# Patient Record
Sex: Female | Born: 1970 | Race: White | Hispanic: No | Marital: Single | Smoking: Never smoker
Health system: Southern US, Community
[De-identification: ages and names within clinical notes are randomized; demographics above are authoritative.]

## PROBLEM LIST (undated history)

## (undated) DIAGNOSIS — F842 Rett's syndrome: Secondary | ICD-10-CM

## (undated) DIAGNOSIS — M810 Age-related osteoporosis without current pathological fracture: Secondary | ICD-10-CM

## (undated) DIAGNOSIS — E079 Disorder of thyroid, unspecified: Secondary | ICD-10-CM

## (undated) DIAGNOSIS — R569 Unspecified convulsions: Secondary | ICD-10-CM

## (undated) DIAGNOSIS — Z151 Genetic susceptibility to epilepsy and neurodevelopmental disorders: Secondary | ICD-10-CM

## (undated) DIAGNOSIS — F79 Unspecified intellectual disabilities: Secondary | ICD-10-CM

## (undated) DIAGNOSIS — M8580 Other specified disorders of bone density and structure, unspecified site: Secondary | ICD-10-CM

## (undated) DIAGNOSIS — R6250 Unspecified lack of expected normal physiological development in childhood: Secondary | ICD-10-CM

## (undated) DIAGNOSIS — S92909A Unspecified fracture of unspecified foot, initial encounter for closed fracture: Secondary | ICD-10-CM

## (undated) DIAGNOSIS — E039 Hypothyroidism, unspecified: Secondary | ICD-10-CM

## (undated) DIAGNOSIS — K5909 Other constipation: Secondary | ICD-10-CM

## (undated) DIAGNOSIS — B009 Herpesviral infection, unspecified: Secondary | ICD-10-CM

## (undated) HISTORY — DX: Unspecified fracture of unspecified foot, initial encounter for closed fracture: S92.909A

## (undated) HISTORY — DX: Age-related osteoporosis without current pathological fracture: M81.0

## (undated) HISTORY — DX: Unspecified intellectual disabilities: F79

## (undated) HISTORY — DX: Hypothyroidism, unspecified: E03.9

## (undated) HISTORY — DX: Rett's syndrome: F84.2

## (undated) HISTORY — DX: Genetic susceptibility to epilepsy and neurodevelopmental disorders: Z15.1

## (undated) HISTORY — DX: Unspecified lack of expected normal physiological development in childhood: R62.50

---

## 1997-11-06 ENCOUNTER — Encounter (HOSPITAL_COMMUNITY): Admission: RE | Admit: 1997-11-06 | Discharge: 1998-02-04 | Payer: Self-pay | Admitting: Family Medicine

## 2004-02-29 ENCOUNTER — Inpatient Hospital Stay (HOSPITAL_COMMUNITY): Admission: EM | Admit: 2004-02-29 | Discharge: 2004-03-03 | Payer: Self-pay | Admitting: Emergency Medicine

## 2005-11-30 ENCOUNTER — Emergency Department (HOSPITAL_COMMUNITY): Admission: EM | Admit: 2005-11-30 | Discharge: 2005-11-30 | Payer: Self-pay | Admitting: Family Medicine

## 2006-05-10 ENCOUNTER — Emergency Department (HOSPITAL_COMMUNITY): Admission: EM | Admit: 2006-05-10 | Discharge: 2006-05-10 | Payer: Self-pay | Admitting: Emergency Medicine

## 2006-05-23 ENCOUNTER — Emergency Department (HOSPITAL_COMMUNITY): Admission: EM | Admit: 2006-05-23 | Discharge: 2006-05-23 | Payer: Self-pay | Admitting: Family Medicine

## 2007-04-03 ENCOUNTER — Emergency Department (HOSPITAL_COMMUNITY): Admission: EM | Admit: 2007-04-03 | Discharge: 2007-04-03 | Payer: Self-pay | Admitting: Emergency Medicine

## 2007-04-24 ENCOUNTER — Emergency Department (HOSPITAL_COMMUNITY): Admission: EM | Admit: 2007-04-24 | Discharge: 2007-04-24 | Payer: Self-pay | Admitting: Family Medicine

## 2007-04-25 ENCOUNTER — Encounter: Admission: RE | Admit: 2007-04-25 | Discharge: 2007-04-25 | Payer: Self-pay | Admitting: Family Medicine

## 2007-09-18 ENCOUNTER — Encounter: Admission: RE | Admit: 2007-09-18 | Discharge: 2007-09-18 | Payer: Self-pay | Admitting: Family Medicine

## 2008-11-18 IMAGING — CR DG FEMUR 2V*L*
4 series · 4 of 4 positions shown · non-contrast
Comparison: None.
COMPARISON: Right ankle radiographs 04/25/07.
COMPARISON: Right tibia/fibula radiographs 04/25/07.

CLINICAL DATA: Cerebral palsy, fell from wheelchair with right greater than left hip to medial malleolar ankle pain. 
 LEFT FEMUR TWO VIEWS:

[t femur with hip  ap left]
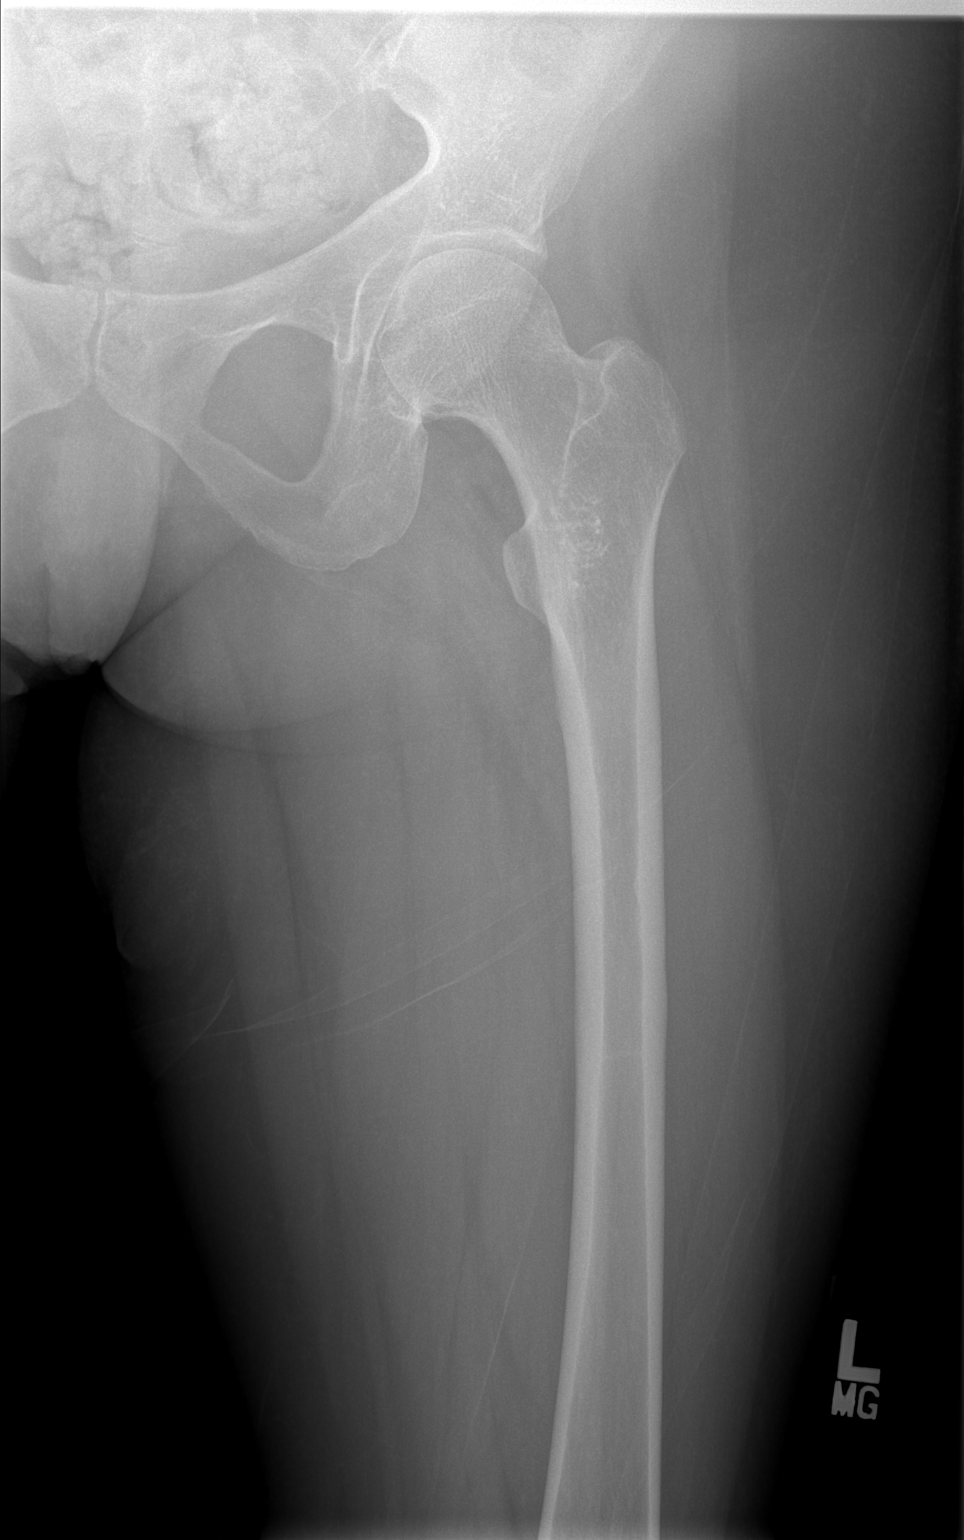

[t femur with knee ap left]
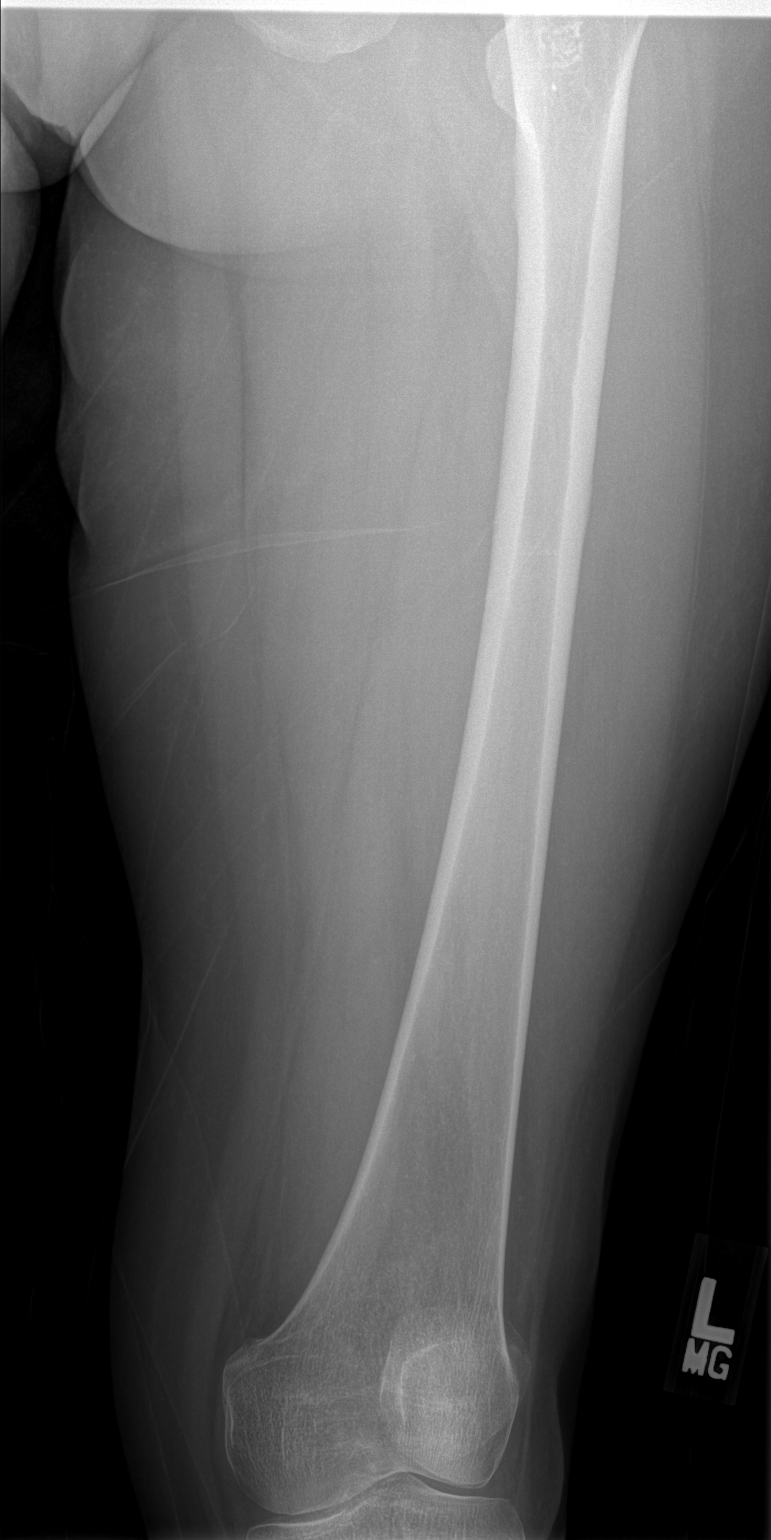

[t femur with knee lat left]
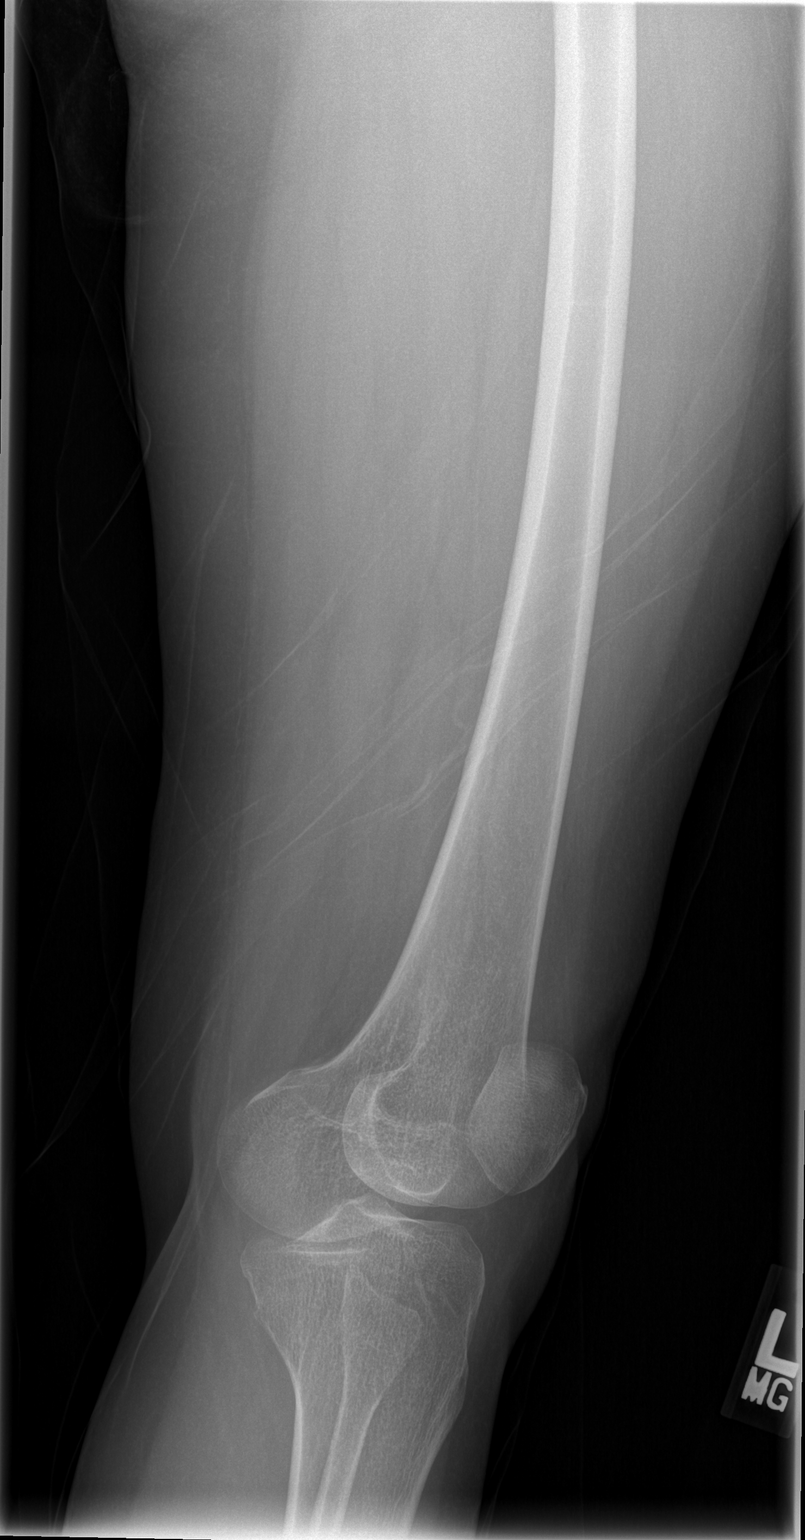

[t femur with hip lat left]
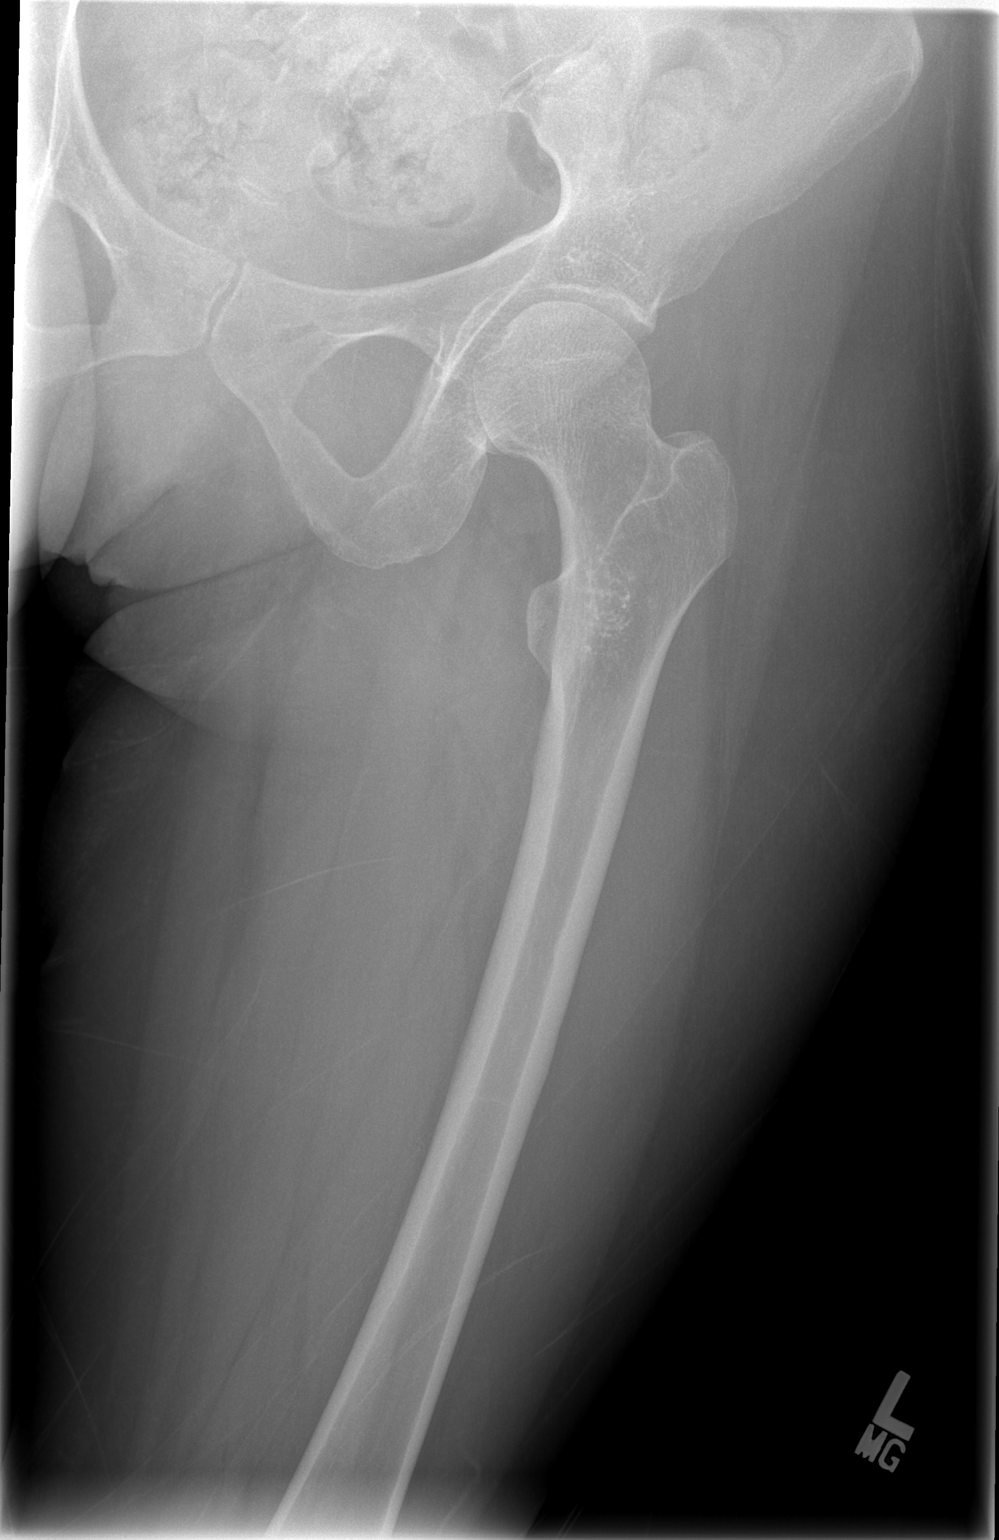

[4 of 4 positions shown; findings below may reference images not displayed]

FINDINGS: Diffuse osteopenia is seen.  No fracture, subluxation/dislocation seen.
IMPRESSION: No acute findings. 
 RIGHT FEMUR TWO VIEWS:
FINDINGS: Diffuse osteopenia is seen.  No acute fracture, subluxation/dislocation noted.
IMPRESSION: No acute findings. 
 LEFT TIBIA/FIBULA TWO VIEWS:
FINDINGS: Diffuse osteopenia is seen.  No acute fracture, subluxation/dislocation seen.
IMPRESSION: No acute findings. 
 RIGHT TIBIA/FIBULA TWO VIEWS:
FINDINGS: Non displaced medial malleolar intra-articular fracture is seen.  Diffuse osteopenia is noted.  No other acute fracture, subluxation/dislocation noted.
IMPRESSION: 1.  Non displaced intra-articular right medial malleolar fracture. 
 2.  Otherwise no acute findings. 
 LEFT ANKLE THREE VIEWS:
FINDINGS: Ankle mortis is symmetric with diffuse osteopenia noted.  No acute fracture, subluxation/dislocation is seen.
IMPRESSION: No acute findings.  
 RIGHT ANKLE THREE VIEWS:
FINDINGS: Non displaced intra-articular medial malleolar fracture is seen.  Diffuse osteopenia is seen.  Ankle mortis is symmetric.  No other acute fracture is seen.
IMPRESSION: 1.  Non displaced medial malleolar fracture (intra-articular). 
 2.  Otherwise no significant abnormality.

## 2008-11-18 IMAGING — CR DG FEMUR 2+V*R*
4 series · 4 of 4 positions shown · non-contrast
Comparison: None.
COMPARISON: Right ankle radiographs 04/25/07.
COMPARISON: Right tibia/fibula radiographs 04/25/07.

CLINICAL DATA: Cerebral palsy, fell from wheelchair with right greater than left hip to medial malleolar ankle pain. 
 LEFT FEMUR TWO VIEWS:

[t femur with hip  ap right]
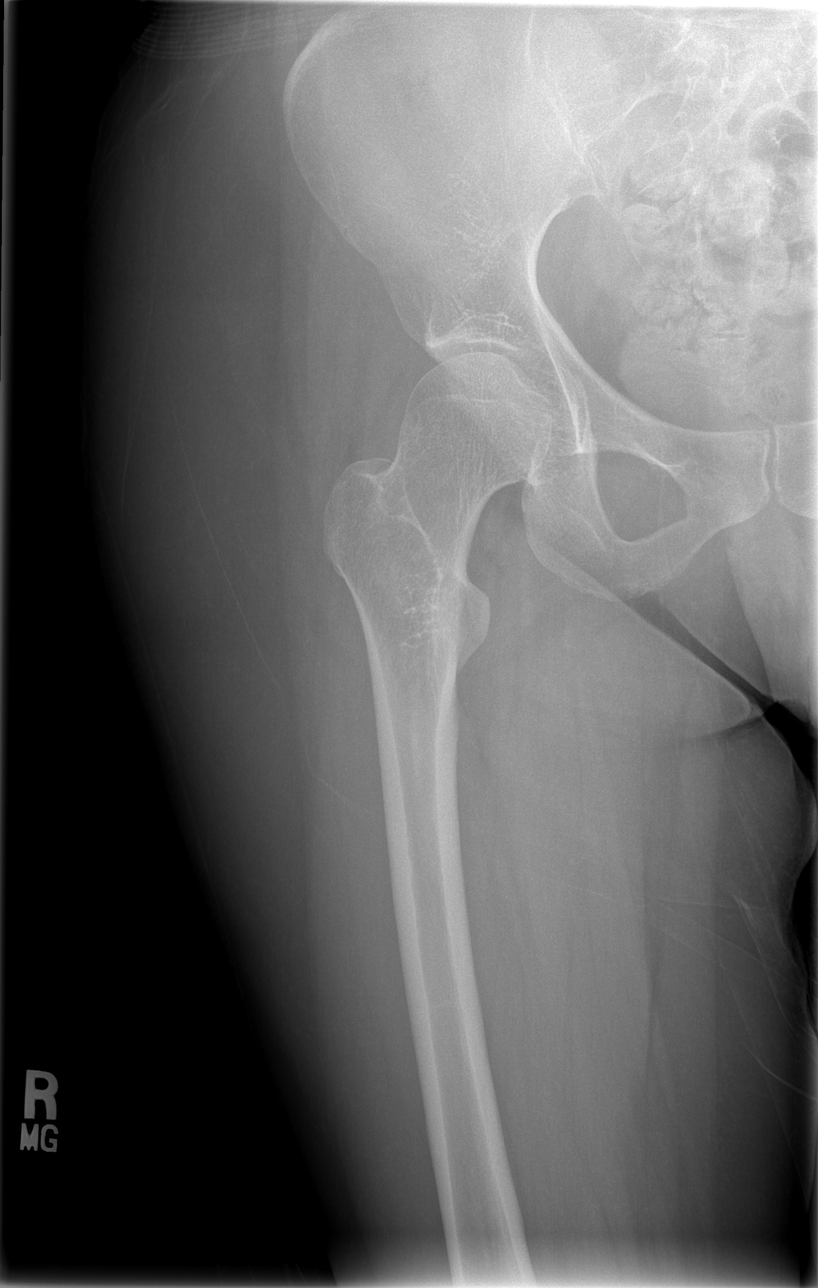

[t femur with knee ap right]
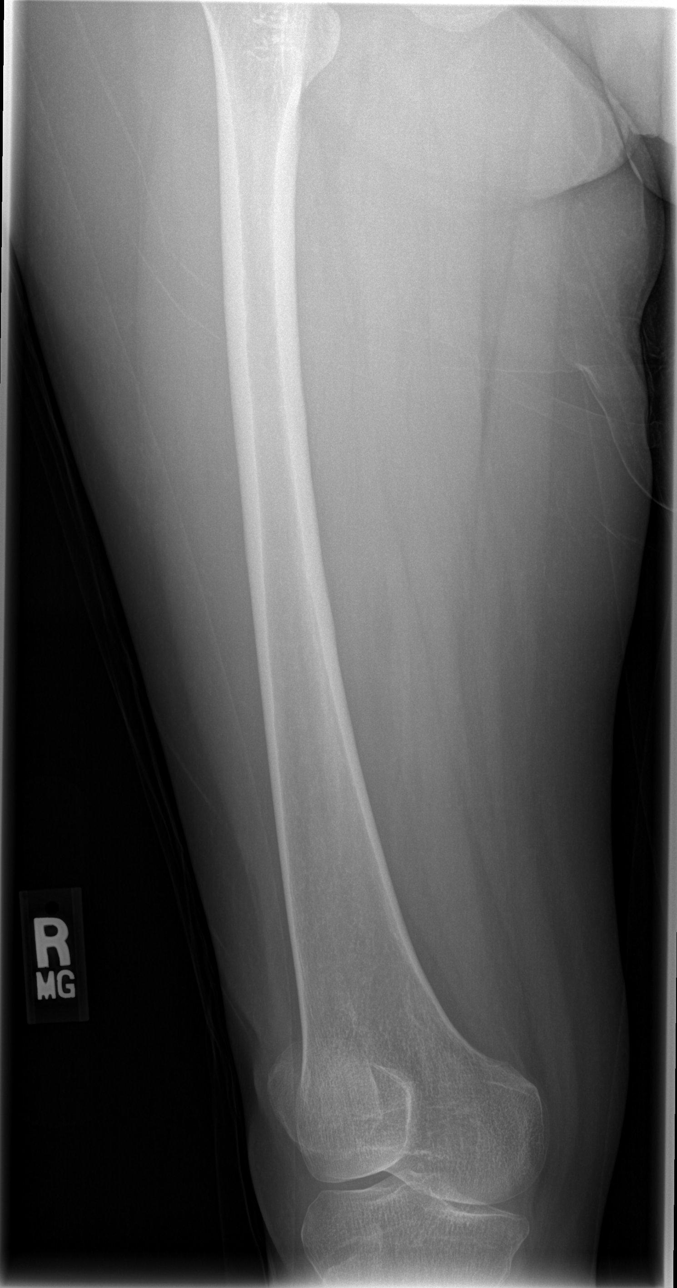

[t femur with hip lat right]
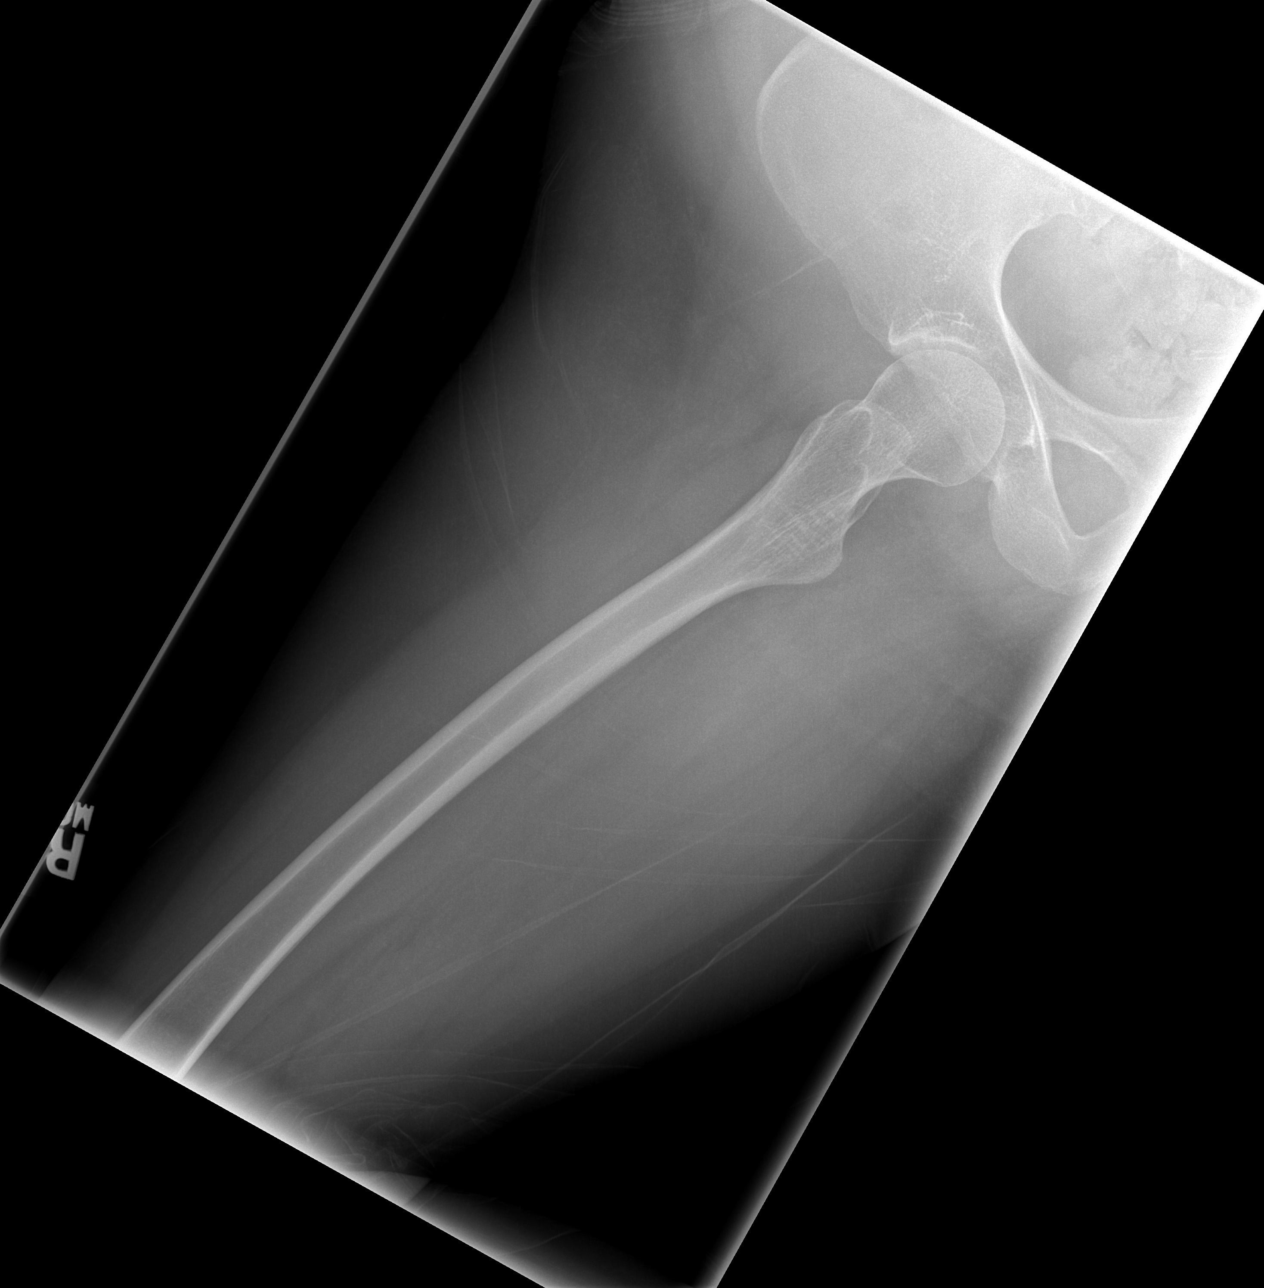

[t femur with knee lat right]
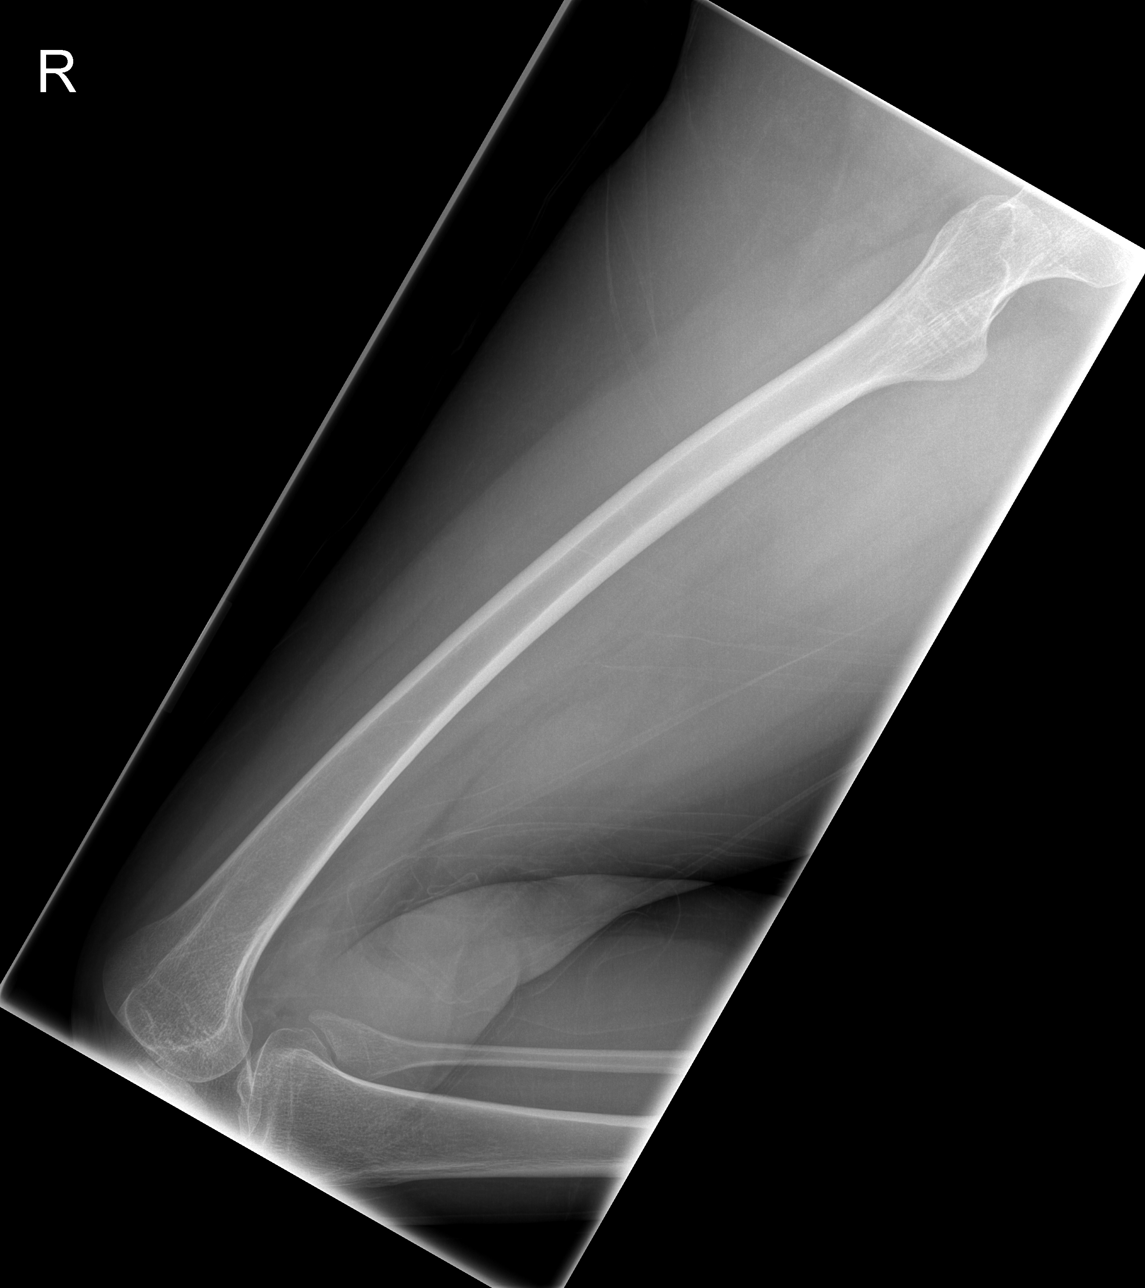

[4 of 4 positions shown; findings below may reference images not displayed]

FINDINGS: Diffuse osteopenia is seen.  No fracture, subluxation/dislocation seen.
IMPRESSION: No acute findings. 
 RIGHT FEMUR TWO VIEWS:
FINDINGS: Diffuse osteopenia is seen.  No acute fracture, subluxation/dislocation noted.
IMPRESSION: No acute findings. 
 LEFT TIBIA/FIBULA TWO VIEWS:
FINDINGS: Diffuse osteopenia is seen.  No acute fracture, subluxation/dislocation seen.
IMPRESSION: No acute findings. 
 RIGHT TIBIA/FIBULA TWO VIEWS:
FINDINGS: Non displaced medial malleolar intra-articular fracture is seen.  Diffuse osteopenia is noted.  No other acute fracture, subluxation/dislocation noted.
IMPRESSION: 1.  Non displaced intra-articular right medial malleolar fracture. 
 2.  Otherwise no acute findings. 
 LEFT ANKLE THREE VIEWS:
FINDINGS: Ankle mortis is symmetric with diffuse osteopenia noted.  No acute fracture, subluxation/dislocation is seen.
IMPRESSION: No acute findings.  
 RIGHT ANKLE THREE VIEWS:
FINDINGS: Non displaced intra-articular medial malleolar fracture is seen.  Diffuse osteopenia is seen.  Ankle mortis is symmetric.  No other acute fracture is seen.
IMPRESSION: 1.  Non displaced medial malleolar fracture (intra-articular). 
 2.  Otherwise no significant abnormality.

## 2009-10-07 ENCOUNTER — Other Ambulatory Visit: Admission: RE | Admit: 2009-10-07 | Discharge: 2009-10-07 | Payer: Self-pay | Admitting: Gynecology

## 2009-10-07 ENCOUNTER — Ambulatory Visit: Payer: Self-pay | Admitting: Gynecology

## 2010-03-29 ENCOUNTER — Emergency Department (HOSPITAL_COMMUNITY): Admission: EM | Admit: 2010-03-29 | Discharge: 2010-03-29 | Payer: Self-pay | Admitting: Emergency Medicine

## 2010-04-11 ENCOUNTER — Emergency Department (HOSPITAL_COMMUNITY): Admission: EM | Admit: 2010-04-11 | Discharge: 2010-04-12 | Payer: Self-pay | Admitting: Emergency Medicine

## 2010-08-01 ENCOUNTER — Encounter: Payer: Self-pay | Admitting: Family Medicine

## 2010-08-02 ENCOUNTER — Encounter: Payer: Self-pay | Admitting: Family Medicine

## 2010-08-31 ENCOUNTER — Emergency Department (HOSPITAL_COMMUNITY)
Admission: EM | Admit: 2010-08-31 | Discharge: 2010-08-31 | Disposition: A | Discharge status: Home / Self Care | Payer: Medicare Other | Attending: Emergency Medicine | Admitting: Emergency Medicine

## 2010-08-31 DIAGNOSIS — Z79899 Other long term (current) drug therapy: Secondary | ICD-10-CM | POA: Insufficient documentation

## 2010-08-31 DIAGNOSIS — F0280 Dementia in other diseases classified elsewhere without behavioral disturbance: Secondary | ICD-10-CM | POA: Insufficient documentation

## 2010-08-31 DIAGNOSIS — F411 Generalized anxiety disorder: Secondary | ICD-10-CM | POA: Insufficient documentation

## 2010-08-31 DIAGNOSIS — G3189 Other specified degenerative diseases of nervous system: Secondary | ICD-10-CM | POA: Insufficient documentation

## 2010-08-31 DIAGNOSIS — Z049 Encounter for examination and observation for unspecified reason: Secondary | ICD-10-CM | POA: Insufficient documentation

## 2010-08-31 DIAGNOSIS — G40909 Epilepsy, unspecified, not intractable, without status epilepticus: Secondary | ICD-10-CM | POA: Insufficient documentation

## 2010-08-31 LAB — BASIC METABOLIC PANEL WITH GFR
BUN: 11 mg/dL (ref 6–23)
CO2: 23 meq/L (ref 19–32)
Calcium: 8.6 mg/dL (ref 8.4–10.5)
Chloride: 112 meq/L (ref 96–112)
Creatinine, Ser: 0.71 mg/dL (ref 0.4–1.2)
GFR calc non Af Amer: >60 mL/min
Glucose, Bld: 128 mg/dL — ABNORMAL HIGH (ref 70–99)
Potassium: 3.8 meq/L (ref 3.5–5.1)
Sodium: 140 meq/L (ref 135–145)

## 2010-09-08 ENCOUNTER — Ambulatory Visit (HOSPITAL_COMMUNITY)
Admission: RE | Admit: 2010-09-08 | Discharge: 2010-09-08 | Disposition: A | Discharge status: Home / Self Care | Payer: Medicare Other | Source: Ambulatory Visit | Attending: Pediatrics | Admitting: Pediatrics

## 2010-09-08 ENCOUNTER — Ambulatory Visit (HOSPITAL_COMMUNITY): Payer: Medicare Other

## 2010-09-08 DIAGNOSIS — R9401 Abnormal electroencephalogram [EEG]: Secondary | ICD-10-CM | POA: Insufficient documentation

## 2010-09-08 DIAGNOSIS — G40319 Generalized idiopathic epilepsy and epileptic syndromes, intractable, without status epilepticus: Secondary | ICD-10-CM | POA: Insufficient documentation

## 2010-09-24 LAB — POCT I-STAT, CHEM 8
BUN: 18 mg/dL (ref 6–23)
Chloride: 106 mEq/L (ref 96–112)
HCT: 44 % (ref 36.0–46.0)
Potassium: 3.8 mEq/L (ref 3.5–5.1)
Sodium: 139 mEq/L (ref 135–145)

## 2010-09-24 LAB — CARBAMAZEPINE LEVEL, TOTAL: Carbamazepine Lvl: 14 ug/mL — ABNORMAL HIGH (ref 4.0–12.0)

## 2010-10-01 ENCOUNTER — Emergency Department (HOSPITAL_COMMUNITY)
Admission: EM | Admit: 2010-10-01 | Discharge: 2010-10-01 | Disposition: A | Discharge status: Home / Self Care | Payer: No Typology Code available for payment source | Attending: Emergency Medicine | Admitting: Emergency Medicine

## 2010-10-01 DIAGNOSIS — Y92410 Unspecified street and highway as the place of occurrence of the external cause: Secondary | ICD-10-CM | POA: Insufficient documentation

## 2010-10-01 DIAGNOSIS — F79 Unspecified intellectual disabilities: Secondary | ICD-10-CM | POA: Insufficient documentation

## 2010-10-01 DIAGNOSIS — G40909 Epilepsy, unspecified, not intractable, without status epilepticus: Secondary | ICD-10-CM | POA: Insufficient documentation

## 2010-10-01 LAB — POCT I-STAT, CHEM 8
BUN: 8 mg/dL (ref 6–23)
Creatinine, Ser: 0.7 mg/dL (ref 0.4–1.2)
Hemoglobin: 13.6 g/dL (ref 12.0–15.0)
Potassium: 4.6 mEq/L (ref 3.5–5.1)
Sodium: 142 mEq/L (ref 135–145)

## 2010-10-01 LAB — CARBAMAZEPINE LEVEL, TOTAL: Carbamazepine Lvl: 10.1 ug/mL (ref 4.0–12.0)

## 2010-11-27 NOTE — Discharge Summary (Signed)
NAME:  Megan Walton, Megan Walton                        ACCOUNT NO.:  [id number]   MEDICAL RECORD NO.:  [id number]                   PATIENT TYPE:  INP   LOCATION:  3006                                 FACILITY:  [facility]   PHYSICIAN:  Lonia Blood, M.D.            DATE OF BIRTH:  [birth year]   DATE OF ADMISSION:  02/28/2004  DATE OF DISCHARGE:                                 DISCHARGE SUMMARY   DISCHARGE DIAGNOSES:  1. Seizure disorder refractory to baseline care.     a. Electroencephalogram during this hospitalization revealing no new        findings.     b. Keppra added to regimen.     c. Seizures diminished at time of discharge.  2. Rett's syndrome.     a. Seizure disorder.     b. Profound mental retardation.     c. No spontaneous speech or communication.  3. Hypothyroidism.  4. Chronic constipation.  5. Severe acne.  6. Intolerance to PENICILLIN, MACROLIDE, and VALPROIC ACID.   DISCHARGE MEDICATIONS:  1. Tegretol q.i.d. dosing at 300 mg, then 200 mg, then 200 mg, then 300 mg.  2. Synthroid 50 mcg p.o. daily.  3. Senokot two p.o. b.i.d.  4. Carnitor 660 mg p.o. b.i.d.  5. Two Tums daily.  6. Keppra solution 100 mg/mL 2.5 mL b.i.d.  7. Differin to face q.a.m.  8. BenzaClin to face q.h.s.   CONSULTATIONS:  [facility].   PROCEDURES:  EEG - March 02, 2004:  Abnormal electroencephalogram showing  diffuse slowing.  The patient very drowsy during exam.  No evidence of  epileptiform activity.   FOLLOW-UP:  The patient is instructed to follow up with her primary care  physician in 14-21 days for routine recheck.  No specific blood tests,  laboratories, or physical exam findings needs to be followed at that time.  Follow-up should simply focus upon whether the patient's seizure activity is  well controlled with the addition of her Keppra.  She should follow up with  her neurologist at [facility] on an as-needed basis.   HISTORY OF PRESENT ILLNESS:   Ms. Megan Walton is a now 40 year old with  Rett's syndrome who resides in a group home.  She has a significant seizure  disorder marked by generalized tonic-clonic movements, full stiffening of  the body, drooling, and frequent drop attacks.  She has been on her  current regimen for multiple years and has been well controlled.  In the  weeks prior to her admission the family began to notice significant increase  in the frequency of seizure activity with upwards of 8-10 seizures per day.  As a result, she was transferred to [facility] emergency room for  evaluation.  She was admitted to the acute units for full neurologic  evaluation and titration of medications.   HOSPITAL COURSE:  Ms. Megan Walton was admitted to the acute units for care of  refractory seizure  disorder.  [facility] was consulted.  Ongoing seizure activity was appreciated during hospitalization.  Under the  direction of [facility], Keppra was added.  The patient's seizure  activity significantly decreased, actually just prior to Keppra initiation.  The patient tolerated Keppra without difficulty.  For the remaining 48 hours  of hospitalization the patient had no significant seizure activity  whatsoever.  An EEG as read by Dr. Avie Echevaria failed to reveal any focal  epileptiform activity and revealed only diffuse generalized slowing, which  is not surprising for the clinical situation.  The patient was felt to be  stable for discharge and on March 03, 2004 she was cleared to return to her  group home.  She will continue her baseline Tegretol at q.i.d. dosing as  noted above, with the addition of Keppra.   Ms. Megan Walton has a few other medical problems to include hypothyroidism,  chronic constipation, and significant acne.  Her usual home regimen of  medications was continued during her hospitalization for these issues.  TSH  obtained during this hospitalization was approximately 1.5 which is at our   target range.  No other medical issues were encountered and the patient was  stable at time of discharge.   The patient was cleared for discharge on March 03, 2004.  She was afebrile  and vital signs were stable.  She appeared comfortable and physical exam was  without significant findings.  She had not had a significant seizure in over  48 hours at the time of discharge.                                                Lonia Blood, M.D.    JTM/MEDQ  D:  03/03/2004  T:  03/03/2004  Job:  [id number]   cc:   Gloriajean Dell. Andrey Campanile, M.D.  [address]  [place]  [place] [address]  Fax: [phone]   Marolyn Hammock. Thad Ranger, M.D.  [address]  [address]  [place]  [place] [address]  Fax: [phone]

## 2010-11-27 NOTE — Procedures (Signed)
EEG #:  [id number]   PROCEDURE:  Electroencephalogram.   PHYSICIAN:  Genene Churn. Love, M.D.   INDICATIONS FOR PROCEDURE:  This patient has a known history of Turret's  syndrome, and has been admitted for the question of seizures.  Twenty years  ago the patient had a seizure.  The episodes are described as the patient  moving the head from side to side.   DESCRIPTION OF PROCEDURE:  This electroencephalogram was recorded during  awake and very drowsy states.  The background rhythm changes from low  voltage fast beta with some alpha activity into 5 Hz theta, which at times  is synchronous.  There is no evidence of any epileptiform activity.  The  theta activity has higher amplitude in the frontotemporal regions.  There is  no evidence of any abnormality with photic stimulation, and the slowing does  not aggravate it by photic stimulation.  Hyperventilation testing was not performed.   IMPRESSION:  This is an abnormal electroencephalogram showing diffuse  slowing.  Much of this is because of the very drowsy state.  No evidence of  any epileptiform activity was seen.  The slowing can be seen with other  etiologies, including toxic metabolic disorders, post-ictal states, etc.,  and should be taken into account with the clinical state of the patient.    Evie Lacks, M.D.   YNW:GNFA  D:  03/02/2004 13:29:30  T:  03/02/2004 15:17:40  Job #:  [id number]   cc:   Vale Haven. Andrey Campanile, M.D.  [address]  [place]  [place] [address]  Fax: [phone]

## 2010-11-27 NOTE — H&P (Signed)
NAME:  Megan Walton, Megan Walton                        ACCOUNT NO.:  [id number]   MEDICAL RECORD NO.:  [id number]                   PATIENT TYPE:  INP   LOCATION:  1826                                 FACILITY:  [facility]   PHYSICIAN:  Gloriajean Dell. Andrey Campanile, M.D.                DATE OF BIRTH:  [birth year]   DATE OF ADMISSION:  02/28/2004  DATE OF DISCHARGE:                                HISTORY & PHYSICAL   IDENTIFICATION:  A 40 year old profoundly retarded white female from a group  home.   CHIEF COMPLAINT:  Increased frequency and duration of seizures in the past  few days.   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 She is without speech and is totally  dependent.  Her seizures have been described as sudden stiffening, sometimes  rocking motions and if standing she may fall.  Her eyes roll back and she  begins drooling.  Has not had any recent head injury or other illnesses.  About 3 weeks ago her dose of Tegretol was increased from 800 mg daily to  1000 mg daily and she now has a therapeutic level but seizures have  increased in frequency, lasting now 6 minutes instead of 1 minute, and  occurring every few hours.  She is followed by a neurologist, possibly Dr.  Anne Hahn.  She was brought to the emergency room. She is admitted for seizure  control.   PAST MEDICAL HISTORY:  1. Rhett's syndrome.  2. Mental retardation noted at age 29, diagnosis of Rhett's syndrome made at     age 66.  She has lived in a group home since about 1997.  3. Hypothyroidism.  4. Constipation.   MEDICATIONS:  1. Tegretol 300 - 200 - 200 - 300 p.o.  2. Synthroid 50 mcg daily.  3. Senokot 2 p.o. b.i.d.  4. L-Carnitor 660 mg b.i.d. (felt to be helpful for some patient's with     Rhett's syndrome).  5. Tums 2 once daily.  6. Differin to face q.a.m.  7. BenzaClin to face q.h.s. for acne.   ALLERGIES/INTOLERANCE:  PENICILLINS, MACROLIDES AND VALPROIC ACID.   FAMILY/SOCIAL HISTORY:  There is no family history of seizures or mental  retardation.  She has 1 brother and 1 sister who are living and well.  Parents are well.  She requires total assistance, wears diapers, has to be  fed and does not communicate or follow commands.   PHYSICAL EXAMINATION:  GENERAL:  Examination reveals a thin, pale white  female in bed in fetal position, in no distress, eyes closed, calm, asleep.  Does not follow commands, is uncooperative. Does not have speech.  No  seizure activity observed.  VITAL SIGNS:  Temperature 98.2, pulse 80, regular, respirations 22, blood  pressure 95/61.  HEENT:  Is without apparent trauma.  She has coarse facial features. Pupils  equal and reactive but difficult to  examine. Tympanic membranes - ear canals  are clear.  NECK:  Supple without adenopathy.  No thyroid enlargement.  SKIN:  Without rash.  She has some benign moles.  HEART:  Regular, no murmurs.  LUNGS:  Clear to auscultation and percussion.  ABDOMEN:  Soft, nontender, she is wearing a diaper.  BREAST/GU/RECTAL:  Did not check.  EXTREMITIES:  She moves all extremities, has good strength, increased tone,  no clonus.  Brisk deep tendon reflexes which are symmetric.   LABORATORY DATA:  Her pH is 7.46, PCO2 34.  White count 6200, hemoglobin  13.0, platelet count 180,000, MCV 95, differential normal.  Sodium 139,  potassium 3.6, BUN 9, glucose 127.  Urinalysis normal except for slight  ketones.  Tegretol level 9.0 (4.0 - 12.0).   ASSESSMENT:  1. 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  2. Seizure disorder which is not controlled and increasing in frequency and     duration in spite of therapeutic Tegretol levels.  3. Hypothyroidism.  4. Minor skin problems.   PLAN:  Admit, observe on neurosciences unit.  Will ask hospital doctors to  follow and obtain neurology consultation.  Seizure precautions.   Check  thyroid function and liver tests.                                                Gloriajean Dell. Andrey Campanile, M.D.    FHW/MEDQ  D:  02/29/2004  T:  02/29/2004  Job:  [id number]

## 2010-11-27 NOTE — Consult Note (Signed)
NAME:  Megan Walton, Megan Walton                        ACCOUNT NO.:  [id number]   MEDICAL RECORD NO.:  [id number]                   PATIENT TYPE:  INP   LOCATION:  3006                                 FACILITY:  [facility]   PHYSICIAN:  Casimiro Needle L. Thad Ranger, M.D.           DATE OF BIRTH:  06/22/1971   DATE OF CONSULTATION:  02/29/2004  DATE OF DISCHARGE:                                   CONSULTATION   REQUESTING PHYSICIAN:  Maxwell Caul, M.D.   REASON FOR CONSULTATION:  Increased seizure activity.   HISTORY OF PRESENT ILLNESS:  This is an inpatient consultation evaluation of  this existing [facility] patient, a 40 year old woman  with a history of Rett's syndrome with resultant profound mental retardation  and epilepsy, who is followed by or practice for many years by Dr. Buzzy Han  in association with Dr. Anne Hahn.  Over the years she has been on Dilantin,  phenobarbital, Depakene, and most recently Tegretol at a present dose of  1000 mg a day in divided doses.  Her usual seizure frequency is about 1 to 2  seizures a month.  The mother took the patient out of the group home on  Thursday to get some pictures taken and noted that since Thursday night she  has had increased seizure activity.  She had about 8 seizures in the group  home on Friday and was subsequently transferred here for further evaluation  and is admitted for observation.  The patient's mother and sister, who are  at the bedside, report that her usual seizures consist of sitting up,  drooling, brief cessation of breathing, and usually lasting about 30  seconds.  The episodes she is having now, however, consist of sitting up and  drooling followed by lying back down and thrashing the head side to side  without generalized convulsive activity.  These episodes are lasting longer,  anywhere up to about 6 minutes.  She has not been feeling ill in any way as  far as her knowledge from the group home.  Has not had any  fevers. She does  note that the patient has been rubbing her nose a little bit last night and  this morning.   PAST MEDICAL HISTORY:  Remarkable for  Rett's syndrome and seizures as  above.  She also has history of hypothyroidism, constipation, and acne.   FAMILY HISTORY, SOCIAL HISTORY, REVIEW OF SYSTEMS:  As outlined in the  admission H&P by Dr. Andrey Campanile.  Remarkable for no known family history of  seizures or mental retardation.  The patient resides in a group home and has  since 1997.  Review of Systems is unavailable secondary to patient's mental  status.  No recent changes in patient's health status per report of mother  and group home.   MEDICATIONS:  1. Tegretol chewable tablets 300 mg at 7 a.m., 200 mg at 11 a.m., 200 mg at     4  p.m., and 300 mg at 8 p.m.  2. Synthroid.  3. Carnitine.  4. Senokot.  5. Enemas p.r.n.   PHYSICAL EXAMINATION:  VITAL SIGNS:  Temperature 99.3, blood pressure 98/54,  pulse 72 and regular, respirations 16.  GENERAL:  This is a profoundly mentally impaired woman supine in the  hospital bed in no evident distress.  HEENT:  Head:  Cranium is normocephalic and atraumatic.  NECK:  Supple without carotid bruit.  HEART:  Regular rate and rhythm without murmur.  CHEST: Clear to limited auscultation with poor inspiration.  ABDOMEN:  Soft with normoactive bowel sounds.  NEUROLOGIC:  Mental Status:  She appears awake and alert.  She does not  follow commands or produce any speech.  She does respond to some mimicking  but is not really able to interact with the examiner.  Cranial Nerves:  She  blinks to threat from both sides.  Extraocular movements seem full.  Face,  tongue, and palate move symmetrically as best I can tell.  Motor:  Slightly  increased tone and at least antigravity strength in all tested extremity  muscles.  Sensation:  Withdraws to pain and tickle in all extremities.   LABORATORY REVIEW:  CBC and CMET are unremarkable.  Tegretol level  taken  last night at about 2100 is 9.0.  TSH is pending.  No urinalysis has been  done.   No neural imaging has been done.   IMPRESSION:  1. Epilepsy with increased seizure frequency.  The etiology of this is     unclear, but it may have to do with the stress of the event outside the     nursing home on Thursday evening.  She had several seizures yesterday but     has not had seizures since about 5 a.m. today and seems to be doing a     little bit better at present.  2. Rett's syndrome with profound mental retardation.   RECOMMENDATIONS:  For the moment, will continue her present dose of Tegretol  1000 mg a day in divided doses.  If the seizures continue, options would  include increasing the Tegretol to 300 mg four times a day and/or adding  Keppra elixir 250 mg b.i.d. Also need to continue to monitor for any  clinical signs of infection.   Will follow with you. Thank you for the consultation.                                               Michael L. Thad Ranger, M.D.    MLR/MEDQ  D:  02/29/2004  T:  02/29/2004  Job:  [id number]   cc:   Maxwell Caul, M.D.  [address]  [place]  [place] [address]  Fax: [phone]   Gloriajean Dell. Andrey Campanile, M.D.  [address]  [place]  [place] [address]  Fax: [phone]   C. Lesia Sago, M.D.  [address]  [address]  [place]  [place] [address]  Fax: [phone]

## 2011-04-22 LAB — I-STAT 8, (EC8 V) (CONVERTED LAB)
Bicarbonate: 21.7
Glucose, Bld: 136 — ABNORMAL HIGH
Hemoglobin: 16.3 — ABNORMAL HIGH
Operator id: 288331
Sodium: 138
TCO2: 23
pCO2, Ven: 30.7 — ABNORMAL LOW

## 2011-04-22 LAB — COMPREHENSIVE METABOLIC PANEL
ALT: 14
Alkaline Phosphatase: 68
BUN: 12
CO2: 21
GFR calc non Af Amer: >60
Glucose, Bld: 127 — ABNORMAL HIGH
Potassium: 3.9
Total Bilirubin: 0.6
Total Protein: 6.6

## 2011-04-22 LAB — VALPROIC ACID LEVEL: Valproic Acid Lvl: <10 — ABNORMAL LOW

## 2011-04-22 LAB — POCT I-STAT CREATININE: Operator id: 288331

## 2011-04-22 LAB — LIPASE, BLOOD: Lipase: 20

## 2011-04-22 LAB — CARBAMAZEPINE LEVEL, TOTAL: Carbamazepine Lvl: 15.9

## 2011-10-08 ENCOUNTER — Emergency Department (HOSPITAL_COMMUNITY): Payer: Medicare Other

## 2011-10-08 ENCOUNTER — Encounter (HOSPITAL_COMMUNITY): Payer: Self-pay | Admitting: Emergency Medicine

## 2011-10-08 ENCOUNTER — Emergency Department (HOSPITAL_COMMUNITY)
Admission: EM | Admit: 2011-10-08 | Discharge: 2011-10-08 | Disposition: A | Discharge status: Home / Self Care | Payer: Medicare Other | Attending: Emergency Medicine | Admitting: Emergency Medicine

## 2011-10-08 DIAGNOSIS — R627 Adult failure to thrive: Secondary | ICD-10-CM | POA: Insufficient documentation

## 2011-10-08 DIAGNOSIS — N390 Urinary tract infection, site not specified: Secondary | ICD-10-CM | POA: Insufficient documentation

## 2011-10-08 DIAGNOSIS — R531 Weakness: Secondary | ICD-10-CM

## 2011-10-08 DIAGNOSIS — R5381 Other malaise: Secondary | ICD-10-CM | POA: Insufficient documentation

## 2011-10-08 DIAGNOSIS — E079 Disorder of thyroid, unspecified: Secondary | ICD-10-CM | POA: Insufficient documentation

## 2011-10-08 DIAGNOSIS — R5383 Other fatigue: Secondary | ICD-10-CM | POA: Insufficient documentation

## 2011-10-08 HISTORY — DX: Other constipation: K59.09

## 2011-10-08 HISTORY — DX: Unspecified intellectual disabilities: F79

## 2011-10-08 HISTORY — DX: Herpesviral infection, unspecified: B00.9

## 2011-10-08 HISTORY — DX: Rett's syndrome: F84.2

## 2011-10-08 HISTORY — DX: Genetic susceptibility to epilepsy and neurodevelopmental disorders: Z15.1

## 2011-10-08 HISTORY — DX: Disorder of thyroid, unspecified: E07.9

## 2011-10-08 HISTORY — DX: Other specified disorders of bone density and structure, unspecified site: M85.80

## 2011-10-08 HISTORY — DX: Unspecified convulsions: R56.9

## 2011-10-08 LAB — URINALYSIS, ROUTINE W REFLEX MICROSCOPIC
Glucose, UA: NEGATIVE mg/dL
Hgb urine dipstick: NEGATIVE
Ketones, ur: 15 mg/dL — AB
Protein, ur: 30 mg/dL — AB

## 2011-10-08 LAB — DIFFERENTIAL
Basophils Relative: 1 % (ref 0–1)
Lymphs Abs: 1.5 10*3/uL (ref 0.7–4.0)
Monocytes Relative: 11 % (ref 3–12)
Neutro Abs: 3.6 10*3/uL (ref 1.7–7.7)
Neutrophils Relative %: 61 % (ref 43–77)

## 2011-10-08 LAB — CBC
Hemoglobin: 14.6 g/dL (ref 12.0–15.0)
MCHC: 33.5 g/dL (ref 30.0–36.0)
Platelets: 246 10*3/uL (ref 150–400)
RBC: 4.46 MIL/uL (ref 3.87–5.11)

## 2011-10-08 LAB — URINE MICROSCOPIC-ADD ON

## 2011-10-08 LAB — COMPREHENSIVE METABOLIC PANEL
ALT: 9 U/L (ref 0–35)
Albumin: 3.8 g/dL (ref 3.5–5.2)
Alkaline Phosphatase: 57 U/L (ref 39–117)
BUN: 8 mg/dL (ref 6–23)
Chloride: 107 mEq/L (ref 96–112)
Potassium: 3.7 mEq/L (ref 3.5–5.1)
Sodium: 144 mEq/L (ref 135–145)
Total Bilirubin: 0.2 mg/dL — ABNORMAL LOW (ref 0.3–1.2)

## 2011-10-08 LAB — CARBAMAZEPINE LEVEL, TOTAL: Carbamazepine Lvl: 8.7 ug/mL (ref 4.0–12.0)

## 2011-10-08 MED ORDER — SODIUM CHLORIDE 0.9 % IV SOLN
Freq: Once | INTRAVENOUS | Status: AC
  Administered 2011-10-08: 13:00:00 via INTRAVENOUS

## 2011-10-08 MED ORDER — CIPROFLOXACIN HCL 500 MG PO TABS: 500.0000 mg | ORAL_TABLET | Freq: Two times a day (BID) | ORAL | Status: AC

## 2011-10-08 NOTE — ED Provider Notes (Signed)
History     CSN: [id number]  Arrival date & time 10/08/11  1212   First MD Initiated Contact with Patient 10/08/11 1233      Chief Complaint  Patient presents with  . Failure To Thrive    poor intake    (Consider location/radiation/quality/duration/timing/severity/associated sxs/prior treatment) HPI Comments: Patient has history of MRCP, is non verbal.  Therefore a Level 5 caveat applies.    Per caregiver, patient is only eating 25% of meals and is less active than normal.  She has also had two seizures yesterday.  There is no fever.  No n/v/d.  No cough.  Otherwise no other symptoms are reported.    Patient is a 41 y.o. female presenting with weakness. The history is provided by a caregiver.  Weakness The primary symptoms include seizures. The symptoms began 2 days ago. The symptoms are worsening. The neurological symptoms are diffuse.  Additional symptoms include weakness.    Past Medical History  Diagnosis Date  . Thyroid disease   . Seizures     No past surgical history on file.  No family history on file.  History  Substance Use Topics  . Smoking status: Not on file  . Smokeless tobacco: Never Used  . Alcohol Use: No    OB History    Grav Para Term Preterm Abortions TAB SAB Ect Mult Living                  Review of Systems  Unable to perform ROS Neurological: Positive for seizures and weakness.    Allergies  Penicillins  Home Medications  No current outpatient prescriptions on file.  BP 93/56  Pulse 66  Temp(Src) 97.5 F (36.4 C) (Oral)  Resp 16  LMP 10/01/2011  Physical Exam  Nursing note and vitals reviewed. Constitutional: She appears well-developed and well-nourished. No distress.  HENT:  Head: Normocephalic and atraumatic.  Eyes: EOM are normal. Pupils are equal, round, and reactive to light.  Neck: Normal range of motion. Neck supple.  Cardiovascular: Normal rate and regular rhythm.   No murmur heard. Pulmonary/Chest: Effort  normal and breath sounds normal. No respiratory distress. She has no wheezes.  Abdominal: Soft. Bowel sounds are normal. She exhibits no distension.  Musculoskeletal: She exhibits no edema.  Neurological: She is alert.       Unable to assess, patient is non-verbal and does not appear to comprehend questions.  Skin: She is not diaphoretic.    ED Course  Procedures (including critical care time)   Labs Reviewed  CARBAMAZEPINE LEVEL, TOTAL  CBC  DIFFERENTIAL  COMPREHENSIVE METABOLIC PANEL  URINALYSIS, ROUTINE W REFLEX MICROSCOPIC   No results found.   No diagnosis found.    MDM  The labs and chest xray look okay.  The ua shows a uti.  She was hydrated and is now looking better.  Her mother is at bedside and says patient is at baseline.        Geoffery Lyons, MD 10/08/11 [phone]

## 2011-10-08 NOTE — Discharge Instructions (Signed)

## 2011-10-08 NOTE — ED Notes (Signed)
Patient transported to X-ray 

## 2011-10-08 NOTE — ED Notes (Signed)
Per caregiver, only eating 25% of her meals-had 2 seizures yesterday

## 2011-12-08 ENCOUNTER — Encounter: Payer: Self-pay | Admitting: *Deleted

## 2011-12-10 ENCOUNTER — Encounter: Payer: Self-pay | Admitting: Gynecology

## 2011-12-10 ENCOUNTER — Ambulatory Visit (INDEPENDENT_AMBULATORY_CARE_PROVIDER_SITE_OTHER): Payer: Medicare Other | Admitting: Gynecology

## 2011-12-10 VITALS — BP 92/58 | Ht 60.0 in | Wt 95.0 lb

## 2011-12-10 DIAGNOSIS — F842 Rett's syndrome: Secondary | ICD-10-CM

## 2011-12-10 DIAGNOSIS — G3189 Other specified degenerative diseases of nervous system: Secondary | ICD-10-CM

## 2011-12-10 DIAGNOSIS — F79 Unspecified intellectual disabilities: Secondary | ICD-10-CM

## 2011-12-10 NOTE — Patient Instructions (Signed)
Follow up in one to 2 years for examination.

## 2011-12-10 NOTE — Progress Notes (Signed)
Megan Walton 18-Sep-1970 [id number]        41 y.o.  for follow up exam.  Patient is from [facility] facility with a history of Rett's syndrome, epileptic seizures, bowel and bladder incontinence and profound mental retardation. Having regular menses.   Past medical history,surgical history, medications, allergies, family history and social history were all reviewed as far as possible given situation and documented in the EPIC chart. ROS:  Was performed through nursing personnel as far as possible.  Exam: Nursing personnel from facility present and assisting with exam Filed Vitals:   12/10/11 1435  BP: 92/58   General appearance  Wheelchair bound significant mental handicapped Skin grossly normal Abdominal  soft, nontender, without masses, organomegaly or hernia Breasts  without masses, retractions, discharge or axillary adenopathy. Pelvic  Ext/BUS/vagina  normal with virginal status  Bimanual cervix palpates normal   Uterus  anteverted, normal size, shape and contour, midline and mobile nontender   Adnexa  Without masses or tenderness    Anus and perineum  normal   Rectovaginal  normal sphincter tone without palpated masses or tenderness.    Assessment/Plan:  41 y.o. female with normal GYN exam. Pap smear 2011 normal. Patient is virginal and Pap smear was not repeated this year. Apparently attempted mammogram but patient wouldn't tolerate. Will leave routine screening tests such as blood work to her primary physicians. Recommend follow up GYN exam in 1-2 years as long as menses remain normal and no other issues.    Dara Lords MD, 3:09 PM 12/10/2011

## 2012-05-15 ENCOUNTER — Other Ambulatory Visit: Payer: Self-pay | Admitting: Family Medicine

## 2012-05-15 DIAGNOSIS — Z1231 Encounter for screening mammogram for malignant neoplasm of breast: Secondary | ICD-10-CM

## 2012-06-06 ENCOUNTER — Other Ambulatory Visit: Payer: Medicare Other

## 2012-07-03 ENCOUNTER — Other Ambulatory Visit: Payer: Medicare Other

## 2012-07-20 ENCOUNTER — Ambulatory Visit
Admission: RE | Admit: 2012-07-20 | Discharge: 2012-07-20 | Disposition: A | Discharge status: Home / Self Care | Payer: Medicare Other | Source: Ambulatory Visit | Attending: Family Medicine | Admitting: Family Medicine

## 2012-07-20 DIAGNOSIS — Z1231 Encounter for screening mammogram for malignant neoplasm of breast: Secondary | ICD-10-CM

## 2012-09-04 ENCOUNTER — Encounter: Payer: Self-pay | Admitting: Nurse Practitioner

## 2012-09-04 DIAGNOSIS — G3189 Other specified degenerative diseases of nervous system: Secondary | ICD-10-CM

## 2012-09-04 DIAGNOSIS — G40309 Generalized idiopathic epilepsy and epileptic syndromes, not intractable, without status epilepticus: Secondary | ICD-10-CM

## 2012-09-08 ENCOUNTER — Encounter: Payer: Self-pay | Admitting: Nurse Practitioner

## 2012-10-05 ENCOUNTER — Ambulatory Visit (INDEPENDENT_AMBULATORY_CARE_PROVIDER_SITE_OTHER): Payer: Medicare Other | Admitting: Nurse Practitioner

## 2012-10-05 ENCOUNTER — Encounter: Payer: Self-pay | Admitting: Nurse Practitioner

## 2012-10-05 VITALS — BP 87/66 | HR 76

## 2012-10-05 DIAGNOSIS — G3189 Other specified degenerative diseases of nervous system: Secondary | ICD-10-CM

## 2012-10-05 DIAGNOSIS — G40309 Generalized idiopathic epilepsy and epileptic syndromes, not intractable, without status epilepticus: Secondary | ICD-10-CM

## 2012-10-05 NOTE — Progress Notes (Signed)
HPI:   Patient returns with representative from group home. Last seen in this office March 2013. She has a history of seizure disorder,  last seizure was in June of last year. Seizure record reviewed. Her Keppra dose was increased by Dr. Dayna Barker who is her primary care provider. Levels of her drugs are also been ordered and were reviewed. According to the representative she has lost about 7 pounds in the last year. She has not had a GI workup. There has been no vomiting or nausea. She does have a good appetite. She goes to a day program.    ROS:   Weight loss  Physical Exam General: well developed, thin,  seated in wheelchair in no evident distress Head: head normocephalic and atraumatic. Oropharynx benign Neck: supple with no carotid  bruits Cardiovascular: regular rate and rhythm, no murmurs  Neurologic Exam Mental Status: Awake and  alert. MR. No speech Does not follow commands.   Cranial Nerves: Fundoscopic exam not performed. Pupils equal, briskly reactive to light. Extraocular movements full without nystagmus. Blinks to threat.   Motor: Increased tone throughout. Normal strength in all tested extremity muscles. Sensory.: withdraws to pain in all 4 extremities.  Coordination:  The patient does not cooperate for testing.  Gait and station:  In wheelchair not ambulated.   Reflexes: 1+ and symmetric. Toes downgoing.     ASSESSMENT: Seizure disorder  with last seizure in June 2013. Keppra was increased at that time by primary care and  Dilantin has been added in small amounts since last seen by primary care. Seizure record as well as levels of carbamazepine, Dilantin and Keppra were reviewed.   PLAN: She will continue medications at current doses. If her primary care feels comfortable changing her meds she can  followup with [place] when necessary.   Nilda Riggs, GNP-BC APRN

## 2012-10-05 NOTE — Progress Notes (Signed)
I have reviewed the note, and I agree with the medical plan. C. Keith Jshon Ibe MD 10/05/2012 5:35 PM  [facility] [address] [place], [place] [address]  Phone [phone] Fax [phone]  

## 2012-10-05 NOTE — Patient Instructions (Signed)
Seizure record without activity since June of last year. Continue Keppra, carbamazepine, and Dilantin at current doses. Followup in our office when necessary, if seizure activity increases.

## 2013-05-03 IMAGING — CR DG CHEST 2V
2 series · 2 of 2 positions shown · non-contrast
Comparison: None.

CLINICAL DATA: Failure to thrive

CHEST - 2 VIEW

[w chest lat]
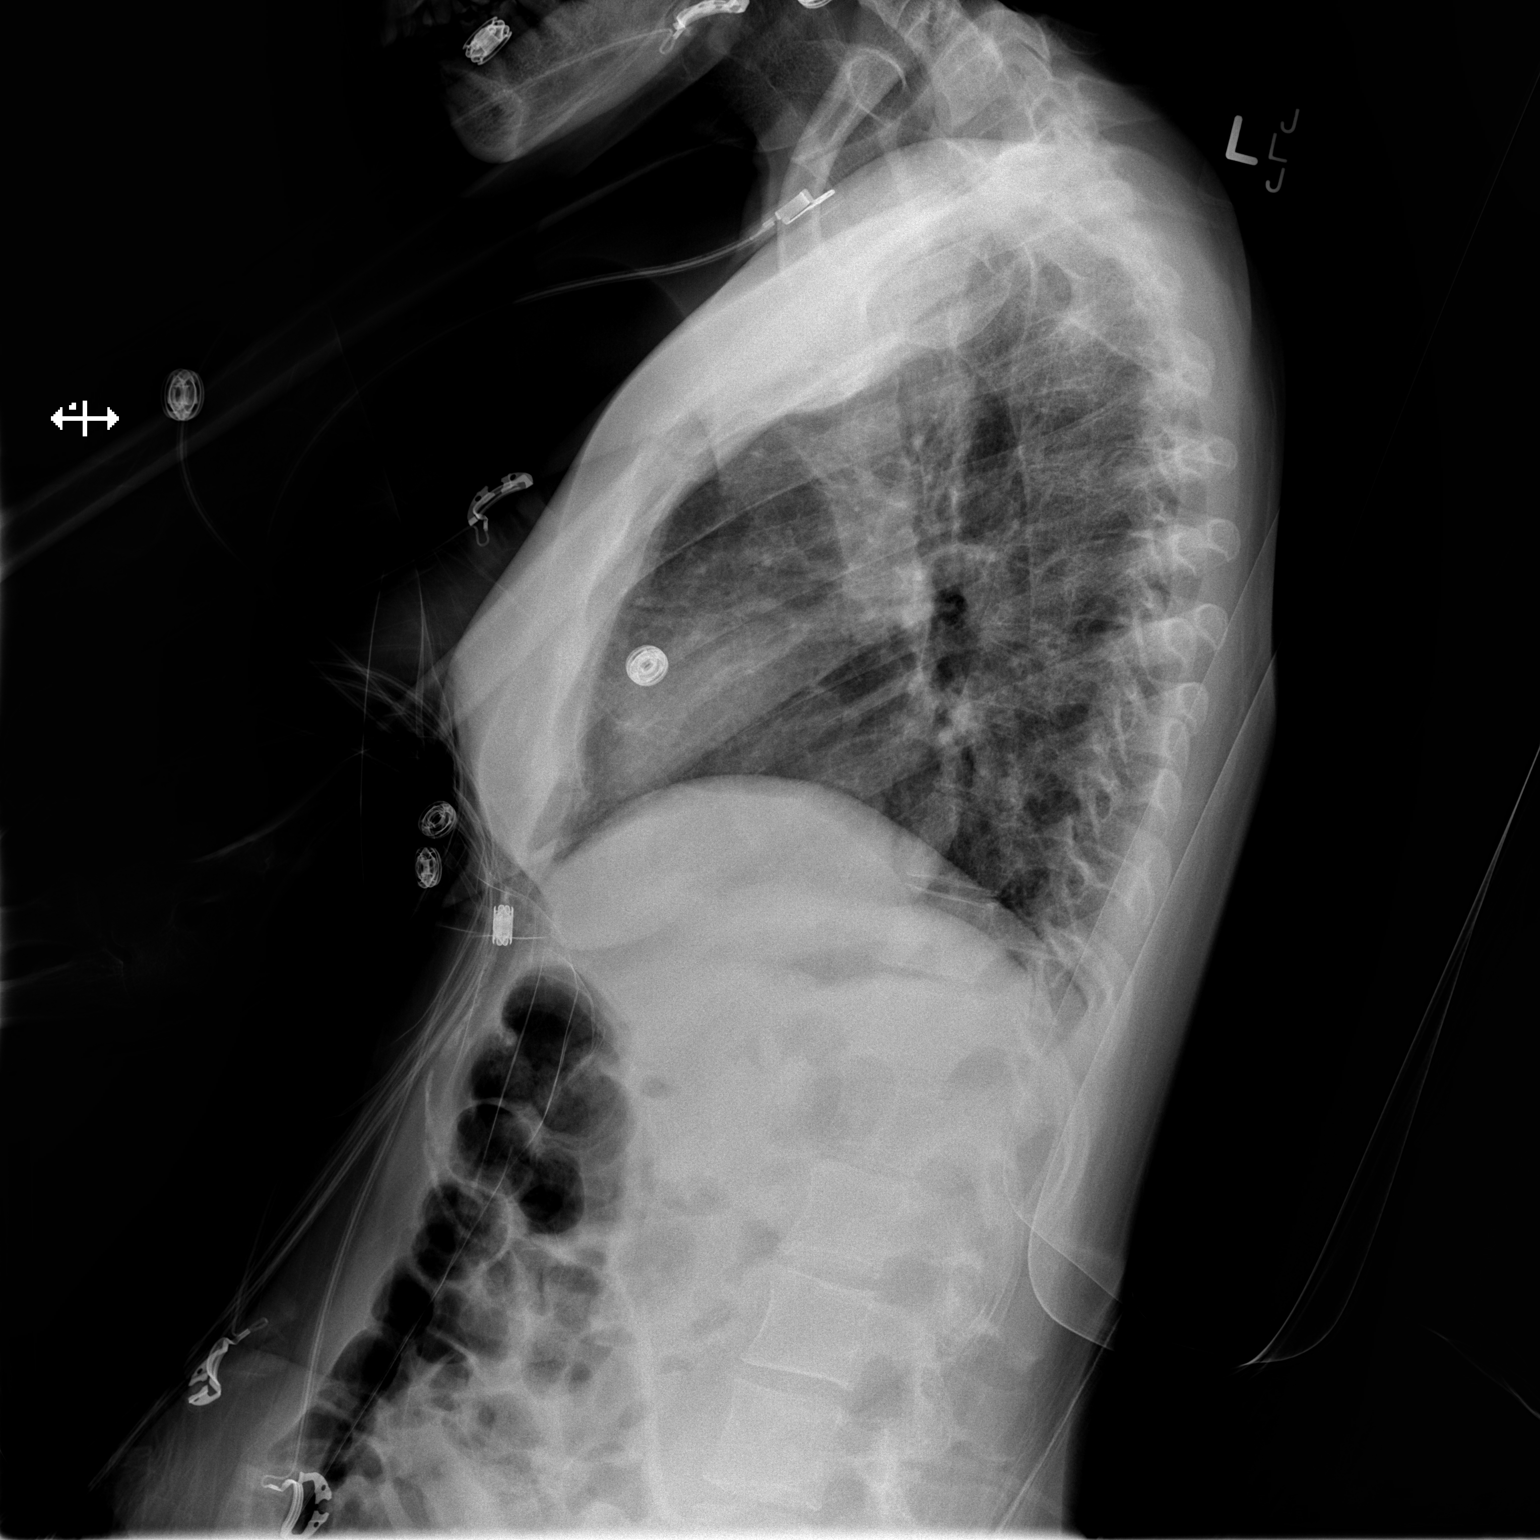

[x chest ap]
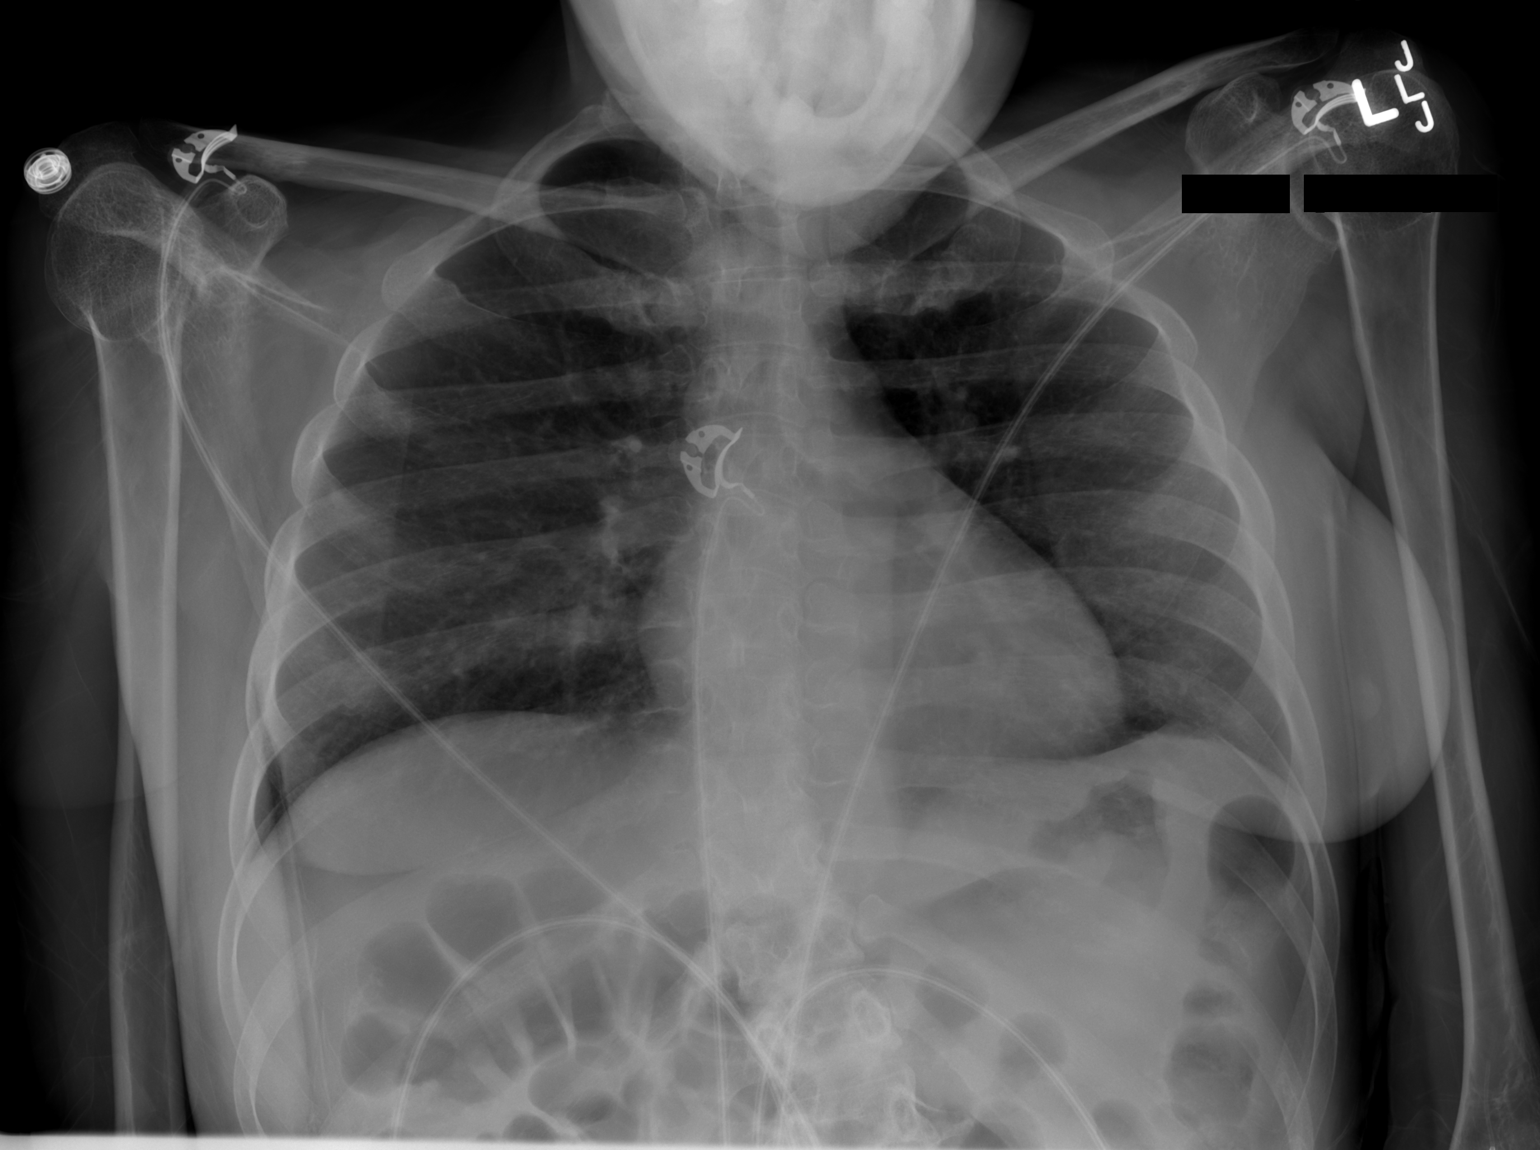

[2 of 2 positions shown; findings below may reference images not displayed]

FINDINGS: Cardiomediastinal silhouette is unremarkable.  No acute
infiltrate or pleural effusion.  No pulmonary edema.  Bony thorax
is unremarkable.
IMPRESSION: No active disease.

## 2013-08-14 ENCOUNTER — Other Ambulatory Visit: Payer: Self-pay | Admitting: Family Medicine

## 2013-08-14 DIAGNOSIS — Z1231 Encounter for screening mammogram for malignant neoplasm of breast: Secondary | ICD-10-CM

## 2013-09-11 ENCOUNTER — Other Ambulatory Visit: Payer: Self-pay | Admitting: Family Medicine

## 2013-09-11 ENCOUNTER — Ambulatory Visit
Admission: RE | Admit: 2013-09-11 | Discharge: 2013-09-11 | Disposition: A | Discharge status: Home / Self Care | Payer: Medicare Other | Source: Ambulatory Visit | Attending: Family Medicine | Admitting: Family Medicine

## 2013-09-11 DIAGNOSIS — R928 Other abnormal and inconclusive findings on diagnostic imaging of breast: Secondary | ICD-10-CM

## 2013-09-11 DIAGNOSIS — Z1231 Encounter for screening mammogram for malignant neoplasm of breast: Secondary | ICD-10-CM

## 2013-10-12 ENCOUNTER — Ambulatory Visit
Admission: RE | Admit: 2013-10-12 | Discharge: 2013-10-12 | Disposition: A | Discharge status: Home / Self Care | Payer: Medicare Other | Source: Ambulatory Visit | Attending: Family Medicine | Admitting: Family Medicine

## 2013-10-12 DIAGNOSIS — R928 Other abnormal and inconclusive findings on diagnostic imaging of breast: Secondary | ICD-10-CM

## 2013-12-18 ENCOUNTER — Other Ambulatory Visit (HOSPITAL_COMMUNITY)
Admission: RE | Admit: 2013-12-18 | Discharge: 2013-12-18 | Disposition: A | Discharge status: Home / Self Care | Payer: Medicare Other | Source: Ambulatory Visit | Attending: Gynecology | Admitting: Gynecology

## 2013-12-18 ENCOUNTER — Encounter: Payer: Self-pay | Admitting: Gynecology

## 2013-12-18 ENCOUNTER — Ambulatory Visit (INDEPENDENT_AMBULATORY_CARE_PROVIDER_SITE_OTHER): Payer: Medicare Other | Admitting: Gynecology

## 2013-12-18 VITALS — BP 100/64 | Ht 60.0 in | Wt 96.0 lb

## 2013-12-18 DIAGNOSIS — Z124 Encounter for screening for malignant neoplasm of cervix: Secondary | ICD-10-CM | POA: Insufficient documentation

## 2013-12-18 DIAGNOSIS — M949 Disorder of cartilage, unspecified: Secondary | ICD-10-CM

## 2013-12-18 DIAGNOSIS — M899 Disorder of bone, unspecified: Secondary | ICD-10-CM

## 2013-12-18 DIAGNOSIS — F842 Rett's syndrome: Secondary | ICD-10-CM

## 2013-12-18 DIAGNOSIS — M8580 Other specified disorders of bone density and structure, unspecified site: Secondary | ICD-10-CM

## 2013-12-18 DIAGNOSIS — G3189 Other specified degenerative diseases of nervous system: Secondary | ICD-10-CM

## 2013-12-18 NOTE — Progress Notes (Signed)
.   Megan Walton Jun 05, 1971 [id number]        43 y.o.  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 Having regular menses.  Information provided by her attendants  Past medical history,surgical history, problem list, medications, allergies, family history and social history were all reviewed and documented as reviewed in the EPIC chart.  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  No cervical or supraclavicular adenopathy. Thyroid normal.  Lungs:  Clear without wheezing, rales or rhonchi Cardiac: RR, without RMG Abdominal:  Soft, nontender, without masses, guarding, rebound, organomegaly or hernia Breasts:  Without masses, retractions, discharge or axillary adenopathy bilaterally. Pelvic:  Ext/BUS/vagina normal. Vaginal exam limited due to virginal status  Cervix palpates normal. Limited visualization Pap done.  Uterus  Axial, normal size, midline and mobile nontender   Adnexa  Without masses or tenderness    Anus and perineum  Normal   Rectovaginal  Normal sphincter tone without palpated masses or tenderness.    Assessment/Plan:  43 y.o. G0P0 female with normal GYN exam. Pap smear was done. Patient had mammogram 10/2013. No routine blood work or testing ordered as I will leave this to her primary physician's discretion. Followup in one to 2 years. Sooner if any issues  Note: This document was prepared with digital dictation and possible smart Lobbyist. Any transcriptional errors that result from this process are unintentional.   Dara Lords MD, 9:11 AM 12/18/2013

## 2013-12-18 NOTE — Patient Instructions (Signed)
Followup in one to 2 years for annual exam

## 2013-12-18 NOTE — Addendum Note (Signed)
Addended by: Dayna Barker on: 12/18/2013 09:52 AM   Modules accepted: Orders

## 2013-12-19 LAB — CYTOLOGY - PAP

## 2014-10-22 ENCOUNTER — Other Ambulatory Visit: Payer: Self-pay

## 2014-10-22 DIAGNOSIS — Z1231 Encounter for screening mammogram for malignant neoplasm of breast: Secondary | ICD-10-CM

## 2014-10-31 ENCOUNTER — Ambulatory Visit
Admission: RE | Admit: 2014-10-31 | Discharge: 2014-10-31 | Disposition: A | Discharge status: Home / Self Care | Payer: Medicare Other | Source: Ambulatory Visit

## 2014-10-31 DIAGNOSIS — Z1231 Encounter for screening mammogram for malignant neoplasm of breast: Secondary | ICD-10-CM

## 2015-11-24 ENCOUNTER — Other Ambulatory Visit: Payer: Self-pay | Admitting: Family Medicine

## 2015-11-24 DIAGNOSIS — Z1231 Encounter for screening mammogram for malignant neoplasm of breast: Secondary | ICD-10-CM

## 2015-12-10 ENCOUNTER — Ambulatory Visit: Payer: Medicare Other

## 2016-01-23 ENCOUNTER — Ambulatory Visit
Admission: RE | Admit: 2016-01-23 | Discharge: 2016-01-23 | Disposition: A | Discharge status: Home / Self Care | Payer: Medicare Other | Source: Ambulatory Visit | Attending: Family Medicine | Admitting: Family Medicine

## 2016-01-23 DIAGNOSIS — Z1231 Encounter for screening mammogram for malignant neoplasm of breast: Secondary | ICD-10-CM

## 2016-02-04 ENCOUNTER — Other Ambulatory Visit: Payer: Self-pay | Admitting: Family Medicine

## 2016-02-04 DIAGNOSIS — R928 Other abnormal and inconclusive findings on diagnostic imaging of breast: Secondary | ICD-10-CM

## 2016-02-06 ENCOUNTER — Other Ambulatory Visit (HOSPITAL_COMMUNITY): Payer: Self-pay | Admitting: Family Medicine

## 2016-02-06 DIAGNOSIS — R1310 Dysphagia, unspecified: Secondary | ICD-10-CM

## 2016-02-10 ENCOUNTER — Ambulatory Visit
Admission: RE | Admit: 2016-02-10 | Discharge: 2016-02-10 | Disposition: A | Discharge status: Home / Self Care | Payer: Medicare Other | Source: Ambulatory Visit | Attending: Family Medicine | Admitting: Family Medicine

## 2016-02-10 DIAGNOSIS — R928 Other abnormal and inconclusive findings on diagnostic imaging of breast: Secondary | ICD-10-CM

## 2016-02-12 ENCOUNTER — Ambulatory Visit (HOSPITAL_COMMUNITY)
Admission: RE | Admit: 2016-02-12 | Discharge: 2016-02-12 | Disposition: A | Discharge status: Home / Self Care | Payer: Medicare Other | Source: Ambulatory Visit | Attending: Family Medicine | Admitting: Family Medicine

## 2016-02-12 DIAGNOSIS — R1310 Dysphagia, unspecified: Secondary | ICD-10-CM | POA: Diagnosis not present

## 2016-02-12 DIAGNOSIS — F79 Unspecified intellectual disabilities: Secondary | ICD-10-CM | POA: Diagnosis not present

## 2016-02-12 DIAGNOSIS — F842 Rett's syndrome: Secondary | ICD-10-CM | POA: Insufficient documentation

## 2016-02-12 DIAGNOSIS — E039 Hypothyroidism, unspecified: Secondary | ICD-10-CM | POA: Insufficient documentation

## 2016-02-12 DIAGNOSIS — B009 Herpesviral infection, unspecified: Secondary | ICD-10-CM | POA: Insufficient documentation

## 2016-02-12 DIAGNOSIS — M8580 Other specified disorders of bone density and structure, unspecified site: Secondary | ICD-10-CM | POA: Diagnosis not present

## 2016-04-01 ENCOUNTER — Encounter: Payer: Self-pay | Admitting: Gynecology

## 2016-04-01 ENCOUNTER — Ambulatory Visit (INDEPENDENT_AMBULATORY_CARE_PROVIDER_SITE_OTHER): Payer: Medicare Other | Admitting: Gynecology

## 2016-04-01 VITALS — BP 114/62

## 2016-04-01 DIAGNOSIS — Z01419 Encounter for gynecological examination (general) (routine) without abnormal findings: Secondary | ICD-10-CM | POA: Diagnosis not present

## 2016-04-01 NOTE — Patient Instructions (Signed)
Follow up as needed

## 2016-04-01 NOTE — Progress Notes (Signed)
    Eula FriedShannon E Kreamer October 19, 1970 [id number]        45 y.o.  G0P0  for breast and pelvic exam. 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 Is reported to be having regular menses and no issues from a gynecologic standpoint point by her attendant.  Past medical history,surgical history, problem list, medications, allergies, family history and social history were all reviewed and documented as reviewed in the EPIC chart.  ROS:  Performed with pertinent positives and negatives included in the history, assessment and plan.   Additional significant findings :  No issues reported by the attendant.   Exam: Kennon PortelaKim Gardner assistant Vitals:   04/01/16 1528  BP: 114/62   There is no height or weight on file to calculate BMI.  General appearance:  Mental retardation with spastic cerebral palsy Skin: Grossly normal HEENT: Without gross lesions.  No cervical or supraclavicular adenopathy. Thyroid normal.  Lungs:  Clear without wheezing, rales or rhonchi Cardiac: RR, without RMG Abdominal:  Soft, nontender, without masses, guarding, rebound, organomegaly or hernia Breasts:  Without masses, retractions, discharge or axillary adenopathy. Pelvic:  Ext, BUS, Vagina virginal limiting exam  Cervix unable to visualize but palpably normal  Uterus difficult to palpate due to voluntary abdominal guarding but grossly normal in size.  Adnexa without gross masses or tenderness    Anus and perineum normal   Rectovaginal normal sphincter tone without palpated masses or tenderness.    Assessment/Plan:  45 y.o. G0P0 female for annual exam with regular menses. No reported issues by her attendant. Pap smear 12/2013 normal. Mammogram 01/2016. We'll defer any further testing to her primary physician as a feel appropriate. Follow up as needed or in one year.    [name],Darin Redmann P MD, 4:02 PM 04/01/2016

## 2016-05-22 ENCOUNTER — Emergency Department (HOSPITAL_COMMUNITY)
Admission: EM | Admit: 2016-05-22 | Discharge: 2016-05-22 | Disposition: A | Discharge status: Home / Self Care | Payer: Medicare Other | Attending: Emergency Medicine | Admitting: Emergency Medicine

## 2016-05-22 ENCOUNTER — Emergency Department (HOSPITAL_COMMUNITY): Payer: Medicare Other

## 2016-05-22 ENCOUNTER — Encounter (HOSPITAL_COMMUNITY): Payer: Self-pay | Admitting: Emergency Medicine

## 2016-05-22 DIAGNOSIS — W19XXXA Unspecified fall, initial encounter: Secondary | ICD-10-CM | POA: Insufficient documentation

## 2016-05-22 DIAGNOSIS — Y939 Activity, unspecified: Secondary | ICD-10-CM | POA: Insufficient documentation

## 2016-05-22 DIAGNOSIS — Z79899 Other long term (current) drug therapy: Secondary | ICD-10-CM | POA: Diagnosis not present

## 2016-05-22 DIAGNOSIS — R930 Abnormal findings on diagnostic imaging of skull and head, not elsewhere classified: Secondary | ICD-10-CM | POA: Insufficient documentation

## 2016-05-22 DIAGNOSIS — Y999 Unspecified external cause status: Secondary | ICD-10-CM | POA: Insufficient documentation

## 2016-05-22 DIAGNOSIS — E039 Hypothyroidism, unspecified: Secondary | ICD-10-CM | POA: Insufficient documentation

## 2016-05-22 DIAGNOSIS — S0181XA Laceration without foreign body of other part of head, initial encounter: Secondary | ICD-10-CM | POA: Diagnosis present

## 2016-05-22 DIAGNOSIS — Y929 Unspecified place or not applicable: Secondary | ICD-10-CM | POA: Diagnosis not present

## 2016-05-22 MED ORDER — LIDOCAINE-EPINEPHRINE-TETRACAINE (LET) SOLUTION
3.0000 mL | Freq: Once | NASAL | Status: AC
  Administered 2016-05-22: 3 mL via TOPICAL
  Filled 2016-05-22: qty 3

## 2016-05-22 NOTE — ED Provider Notes (Signed)
[facility]-EMERGENCY DEPT Provider Note   CSN: [id number] Arrival date & time: 05/22/16  [id number]     History   Chief Complaint Chief Complaint  Patient presents with  . Fall    HPI Megan Walton is a 45 y.o. female. She presents for evaluation after a fall. She lives in a group home and refer supervision. She had a fall this morning has laceration above her left eye. Is at her baseline per her caregiver who accompanies her. Patient is otherwise nonverbal at baseline. No recent illnesses or other change in her health recently.  HPI  Past Medical History:  Diagnosis Date  . Constipation, chronic   . Developmental delay   . Foot fracture   . Herpes simplex type 1 antibody positive   . Hypothyroidism   . Mental retardation   . Mental retardation   . 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 Osteopenia    Right Hip  . Rett syndrome   . Rett's syndrome   . Rett's syndrome   . Seizures (HCC)   . Thyroid disease     Patient Active Problem List   Diagnosis Date Noted  . 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 No pertinent surgical history.  OB History    Gravida Para Term Preterm AB Living   0             SAB TAB Ectopic Multiple Live Births                   Home Medications    Prior to Admission medications   Medication Sig Start Date End Date Taking? Authorizing Provider  acyclovir (ZOVIRAX) 400 MG tablet Take 400 mg by mouth 2 (two) times daily.    Historical Provider, MD  calcium carbonate (TUMS - DOSED IN MG ELEMENTAL CALCIUM) 500 MG chewable tablet Chew 1 tablet by mouth daily.    Historical Provider, MD  carbamazepine (TEGRETOL) 100 MG chewable tablet Chew 200-300 mg by mouth 4 (four) times daily. Patient takes 300mg  at 7am and 8pm and 200mg  11am and 4pm    Historical Provider, MD  cetaphil (CETAPHIL) cream Apply 1 application  topically daily.    Historical Provider, MD  diazepam (DIASTAT ACUDIAL) 10 MG GEL Place 10 mg rectally once. Use as needed for seizures > than 5 mins.    Historical Provider, MD  Docosanol (ABREVA) 10 % CREA Apply topically daily.    Historical Provider, MD  erythromycin with ethanol (THERAMYCIN) 2 % external solution Apply 1 application topically daily.    Historical Provider, MD  levETIRAcetam (KEPPRA) 1000 MG tablet Take by mouth. Take 1000 mg in the morning, 1250 mg at bedtime.    Historical Provider, MD  levOCARNitine (CARNITOR) 330 MG tablet Take 330 mg by mouth 3 (three) times daily. Take 2 tablets 3 x day.    Historical Provider, MD  levothyroxine (SYNTHROID, LEVOTHROID) 50 MCG tablet Take 50 mcg by mouth daily.    Historical Provider, MD  phenytoin (DILANTIN) 50 MG tablet Chew by mouth 3 (three) times daily.    Historical Provider, MD  polyethylene glycol (MIRALAX / GLYCOLAX) packet Take 17 g by mouth at bedtime. Mix 17 gram in juice or water at bedtime    Historical Provider, MD  senna (SENOKOT) 8.6 MG TABS Take 1 tablet by mouth 2 (two) times daily.    Historical Provider, MD  Vitamin D, Ergocalciferol, (DRISDOL) 50000 UNITS CAPS Take 50,000 Units by mouth every 7 (seven) days.    Historical Provider, MD    Family History Family History  Problem Relation Age of Onset  . Lymphoma Father   . Healthy Mother   . Healthy Sister   . ADD / ADHD Brother   . Healthy Brother     Social History Social History  Substance Use Topics  . Smoking status: Never Smoker  . Smokeless tobacco: Never Used  . Alcohol use No     Allergies   Biaxin [clarithromycin]; Cephalexin; Clarithromycin; Divalproex sodium; Keflex [cephalexin]; Macrolides and ketolides; and Penicillins   Review of Systems Review of Systems  Unable to perform ROS: Patient nonverbal     Physical Exam Updated Vital Signs BP 116/85 (BP Location: Right Arm)   Pulse 68   Temp 98 F (36.7 C) (Axillary)   Resp 16    LMP 05/15/2016   SpO2 100%   Physical Exam  Constitutional: She appears well-developed and well-nourished. No distress.  HENT:  Head: Normocephalic.    Eyes: Conjunctivae are normal. Pupils are equal, round, and reactive to light. No scleral icterus.  Neck: Normal range of motion. Neck supple. No thyromegaly present.  Cardiovascular: Normal rate and regular rhythm.  Exam reveals no gallop and no friction rub.   No murmur heard. Pulmonary/Chest: Effort normal and breath sounds normal. No respiratory distress. She has no wheezes. She has no rales.  Abdominal: Soft. Bowel sounds are normal. She exhibits no distension. There is no tenderness. There is no rebound.  Musculoskeletal: Normal range of motion.  Neurological: She is alert.  Skin: Skin is warm and dry. No rash noted.  Psychiatric: She has a normal mood and affect. Her behavior is normal.     ED Treatments / Results  Labs (all labs ordered are listed, but only abnormal results are displayed) Labs Reviewed - No data to display  EKG  EKG Interpretation None       Radiology No results found.  Procedures Procedures (including critical care time)  Medications Ordered in ED Medications  lidocaine-EPINEPHrine-tetracaine (LET) solution (3 mLs Topical Given 05/22/16 0951)     Initial Impression / Assessment and Plan / ED Course  I have reviewed the triage vital signs and the nursing notes.  Pertinent labs & imaging results that were available during my care of the patient were reviewed by me and considered in my medical decision making (see chart for details).  Clinical Course     One cm laceration approximated with Dermabond.  Final Clinical Impressions(s) / ED Diagnoses   Final diagnoses:  Laceration of forehead, initial encounter    New Prescriptions Discharge Medication List as of 05/22/2016 12:14 PM       Rolland PorterMark Allante Beane, MD 06/07/16 2316

## 2016-05-22 NOTE — ED Triage Notes (Signed)
Per caregiver, states she was found on floor this am-unwitnessed fall-laceration above left eye-patient in nonverbal-ambulates with assistance and gait belt

## 2016-06-17 ENCOUNTER — Ambulatory Visit (INDEPENDENT_AMBULATORY_CARE_PROVIDER_SITE_OTHER): Payer: Medicare Other | Admitting: Gynecology

## 2016-06-17 ENCOUNTER — Encounter: Payer: Self-pay | Admitting: Gynecology

## 2016-06-17 VITALS — BP 120/74

## 2016-06-17 DIAGNOSIS — N926 Irregular menstruation, unspecified: Secondary | ICD-10-CM | POA: Diagnosis not present

## 2016-06-17 NOTE — Progress Notes (Signed)
    Megan FriedShannon E Walton [birth year] [id number]        45 y.o.  G0P0 presents with her attendant and her mother referred for irregular bleeding. The patient has Rett's syndrome which includes profound mental retardation, bowel and bladder incontinence and epileptic seizures. Has had less frequent menses this past year with blood work indicating menopause by attendant's history.  No prolonged or heavy bleeding but the last episode was light lasting several days. Was referred for evaluation.  Past medical history,surgical history, problem list, medications, allergies, family history and social history were all reviewed and documented in the EPIC chart.  Directed ROS with pertinent positives and negatives documented in the history of present illness/assessment and plan.  Exam: Kennon PortelaKim Gardner assistant Vitals:   06/17/16 0924  BP: 120/74   General appearance:  Normal Abdomen soft without masses guarding rebound Pelvic external BUS vagina normal. Cervix normal. No bleeding. Bimanual exam no gross masses or tenderness but limited by voluntary guarding. Rectal exam normal  Assessment/Plan:  45 y.o. G0P0 with what appears to be perimenopausal irregularity. She's having less frequent menses which are becoming lighter. No prolonged or significantly atypical bleeding. Reviewed situation with the patient's mother and attendant. Options are observation with further evaluation if prolonged or atypical bleeding, ultrasound now for pelvic surveillance/endometrial echo understanding the issues if abnormal then what would be the next step and lastly  attempted endometrial biopsy now with or without anesthesia. After reviewing the various possibilities to include missed diagnoses of carcinoma we all feel comfortable with observation for now. They will keep a menstrual calendar as long as less frequent but relatively regular menses when occurs them will monitor. If more than 1 year without menses and then bleeds will need  evaluation. If prolonged or atypical bleeding them will need evaluation.   [name],Megan Walton, 9:44 AM 06/17/2016

## 2016-06-17 NOTE — Patient Instructions (Addendum)
Keep a menstrual calendar and if significant atypical bleeding call. If less frequent but "regular" menses then will monitor for now.

## 2016-07-09 ENCOUNTER — Other Ambulatory Visit: Payer: Self-pay | Admitting: Family Medicine

## 2016-07-09 DIAGNOSIS — N6002 Solitary cyst of left breast: Secondary | ICD-10-CM

## 2016-07-30 ENCOUNTER — Emergency Department (HOSPITAL_COMMUNITY): Payer: Medicare Other

## 2016-07-30 ENCOUNTER — Emergency Department (HOSPITAL_COMMUNITY)
Admission: EM | Admit: 2016-07-30 | Discharge: 2016-07-31 | Disposition: A | Discharge status: Home / Self Care | Payer: Medicare Other | Attending: Emergency Medicine | Admitting: Emergency Medicine

## 2016-07-30 ENCOUNTER — Encounter (HOSPITAL_COMMUNITY): Payer: Self-pay | Admitting: *Deleted

## 2016-07-30 DIAGNOSIS — W0110XA Fall on same level from slipping, tripping and stumbling with subsequent striking against unspecified object, initial encounter: Secondary | ICD-10-CM | POA: Insufficient documentation

## 2016-07-30 DIAGNOSIS — Y939 Activity, unspecified: Secondary | ICD-10-CM | POA: Diagnosis not present

## 2016-07-30 DIAGNOSIS — S0101XA Laceration without foreign body of scalp, initial encounter: Secondary | ICD-10-CM | POA: Diagnosis not present

## 2016-07-30 DIAGNOSIS — Y929 Unspecified place or not applicable: Secondary | ICD-10-CM | POA: Insufficient documentation

## 2016-07-30 DIAGNOSIS — Y999 Unspecified external cause status: Secondary | ICD-10-CM | POA: Insufficient documentation

## 2016-07-30 DIAGNOSIS — S0990XA Unspecified injury of head, initial encounter: Secondary | ICD-10-CM | POA: Diagnosis present

## 2016-07-30 DIAGNOSIS — W19XXXA Unspecified fall, initial encounter: Secondary | ICD-10-CM

## 2016-07-30 DIAGNOSIS — E039 Hypothyroidism, unspecified: Secondary | ICD-10-CM | POA: Diagnosis not present

## 2016-07-30 NOTE — ED Triage Notes (Signed)
EMS reports pt from Group Home [facility], [address], [place]. Pt talking a shower, slipped and fell. Hit head, small hematoma to forehead. No LOC. Pt is mute

## 2016-07-30 NOTE — ED Provider Notes (Signed)
[facility]-EMERGENCY DEPT Provider Note   CSN: [id number] Arrival date & time: 07/30/16  1731     History   Chief Complaint Chief Complaint  Patient presents with  . Fall    HPI Megan Walton is a 46 y.o. female.  The history is provided by a caregiver.  Fall  This is a new problem. The current episode started 6 to 12 hours ago. Episode frequency: once. Nothing aggravates the symptoms. Nothing relieves the symptoms. She has tried nothing for the symptoms.   Remainder of history, ROS, and physical exam limited due to patient's condition (nonverbal). Additional information was obtained from caregiver who was present.   Level V Caveat.   Past Medical History:  Diagnosis Date  . Constipation, chronic   . Developmental delay   . Foot fracture   . Herpes simplex type 1 antibody positive   . Hypothyroidism   . Mental retardation   . Mental retardation   . 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 Osteopenia    Right Hip  . Rett syndrome   . Rett's syndrome   . Rett's syndrome   . Seizures (HCC)   . Thyroid disease     Patient Active Problem List   Diagnosis Date Noted  . 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 No pertinent surgical history.  OB History    Gravida Para Term Preterm AB Living   0             SAB TAB Ectopic Multiple Live Births                   Home Medications    Prior to Admission medications   Medication Sig Start Date End Date Taking? Authorizing Provider  acyclovir (ZOVIRAX) 400 MG tablet Take 400 mg by mouth 2 (two) times daily.   Yes Historical Provider, MD  calcium carbonate (TUMS - DOSED IN MG ELEMENTAL CALCIUM) 500 MG chewable tablet Chew 1 tablet by mouth daily.   Yes Historical Provider, MD  carbamazepine (TEGRETOL) 100 MG chewable tablet Chew 200-300 mg by mouth 4 (four) times daily. Patient takes 300mg   at 7am and 8pm and 200mg  11am and 4pm   Yes Historical Provider, MD  cetaphil (CETAPHIL) cream Apply 1 application topically daily.   Yes Historical Provider, MD  Cholecalciferol (VITAMIN D PO) Take 2,000 Units by mouth daily.    Yes Historical Provider, MD  Docosanol (ABREVA) 10 % CREA Apply topically daily.   Yes Historical Provider, MD  levETIRAcetam (KEPPRA) 1000 MG tablet Take by mouth. Take 1000 mg in the morning, 1250 mg at bedtime.   Yes Historical Provider, MD  levOCARNitine (CARNITOR) 330 MG tablet Take 330 mg by mouth 3 (three) times daily. Take 2 tablets 3 x day.   Yes Historical Provider, MD  levothyroxine (SYNTHROID, LEVOTHROID) 50 MCG tablet Take 50 mcg by mouth once a week.    Yes Historical Provider, MD  phenytoin (DILANTIN) 50 MG tablet Chew by mouth 3 (three) times daily.   Yes Historical Provider, MD  polyethylene glycol (MIRALAX / GLYCOLAX) packet Take 17 g by mouth at bedtime. Mix 17 gram in juice or water at bedtime   Yes Historical Provider, MD  senna (SENOKOT) 8.6 MG TABS Take 1 tablet by mouth 2 (two) times daily.   Yes Historical Provider, MD    Family History Family History  Problem Relation  Age of Onset  . Lymphoma Father   . Healthy Mother   . Healthy Sister   . ADD / ADHD Brother   . Healthy Brother     Social History Social History  Substance Use Topics  . Smoking status: Never Smoker  . Smokeless tobacco: Never Used  . Alcohol use No     Allergies   Biaxin [clarithromycin]; Cephalexin; Clarithromycin; Divalproex sodium; Keflex [cephalexin]; Macrolides and ketolides; and Penicillins   Review of Systems Review of Systems  Unable to perform ROS: Patient nonverbal     Physical Exam Updated Vital Signs BP 125/79 (BP Location: Right Arm)   Pulse 77   Temp 97.4 F (36.3 C) (Axillary)   Resp 20   SpO2 99%   Physical Exam  Constitutional: She appears well-developed and well-nourished. No distress.  HENT:  Head: Normocephalic. Head is with  contusion and with laceration.    Right Ear: External ear normal.  Left Ear: External ear normal.  Nose: Nose normal.  Eyes: Conjunctivae and EOM are normal. Pupils are equal, round, and reactive to light. Right eye exhibits no discharge. Left eye exhibits no discharge. No scleral icterus.  Neck: Normal range of motion. Neck supple.  Cardiovascular: Normal rate, regular rhythm and normal heart sounds.  Exam reveals no gallop and no friction rub.   No murmur heard. Pulses:      Radial pulses are 2+ on the right side, and 2+ on the left side.       Dorsalis pedis pulses are 2+ on the right side, and 2+ on the left side.  Pulmonary/Chest: Effort normal and breath sounds normal. No stridor. No respiratory distress. She has no wheezes.  Abdominal: Soft. She exhibits no distension. There is no tenderness.  Musculoskeletal: She exhibits no edema or tenderness.       Cervical back: She exhibits no bony tenderness.       Thoracic back: She exhibits no bony tenderness.       Lumbar back: She exhibits no bony tenderness.  Clavicles stable. Chest stable to AP/Lat compression. Pelvis stable to Lat compression. No obvious extremity deformity. No chest or abdominal wall contusion.  Neurological: She is alert.  Nonverbal. Moving all extremities  Skin: Skin is warm and dry. No rash noted. She is not diaphoretic. No erythema.  Psychiatric: She has a normal mood and affect.     ED Treatments / Results  Labs (all labs ordered are listed, but only abnormal results are displayed) Labs Reviewed - No data to display  EKG  EKG Interpretation None       Radiology Ct Head Wo Contrast  Result Date: 07/31/2016 CLINICAL DATA:  Slip and fall in the shower. Head injury with forehead hematoma. No loss of consciousness. EXAM: CT HEAD WITHOUT CONTRAST TECHNIQUE: Contiguous axial images were obtained from the base of the skull through the vertex without intravenous contrast. COMPARISON:  Head CT 05/22/2016  FINDINGS: Brain: No evidence of acute infarction, hemorrhage, hydrocephalus, extra-axial collection or mass lesion/mass effect. Vascular: No hyperdense vessel or unexpected calcification. Skull: Normal. Negative for fracture or focal lesion. Sinuses/Orbits: Paranasal sinuses and mastoid air cells are clear. The visualized orbits are unremarkable. Other: None. IMPRESSION: No acute intracranial abnormality.  No skull fracture. Electronically Signed   By: Rubye Oaks M.D.   On: 07/31/2016 00:36    Procedures Procedures (including critical care time)  Medications Ordered in ED Medications - No data to display   Initial Impression / Assessment and Plan /  ED Course  I have reviewed the triage vital signs and the nursing notes.  Pertinent labs & imaging results that were available during my care of the patient were reviewed by me and considered in my medical decision making (see chart for details).     Patient is nonverbal and unable to cooperate with exam. CT head obtained to assess for ICH; which was negative. Will allow laceration to close by secondary means. The patient is safe for discharge with strict return precautions.   Final Clinical Impressions(s) / ED Diagnoses   Final diagnoses:  Fall, initial encounter  Injury of head, initial encounter  Laceration of scalp, initial encounter   Disposition: Discharge  Condition: Good  I have discussed the results, Dx and Tx plan with the patient's caregiver who expressed understanding and agree(s) with the plan. Discharge instructions discussed at great length. The patient's caregiver was given strict return precautions who verbalized understanding of the instructions. No further questions at time of discharge.    New Prescriptions   No medications on file    Follow Up: [facility] [address] [place] [id number] [phone]  Schedule an appointment as soon as possible for a visit  As needed      Nira ConnPedro  Eduardo Shyloh Krinke, MD 07/31/16 [phone]

## 2016-08-24 ENCOUNTER — Ambulatory Visit
Admission: RE | Admit: 2016-08-24 | Discharge: 2016-08-24 | Disposition: A | Discharge status: Home / Self Care | Payer: Medicare Other | Source: Ambulatory Visit | Attending: Family Medicine | Admitting: Family Medicine

## 2016-08-24 DIAGNOSIS — N6002 Solitary cyst of left breast: Secondary | ICD-10-CM

## 2017-06-16 DIAGNOSIS — S99912A Unspecified injury of left ankle, initial encounter: Secondary | ICD-10-CM | POA: Diagnosis present

## 2017-06-16 DIAGNOSIS — Y93E1 Activity, personal bathing and showering: Secondary | ICD-10-CM | POA: Diagnosis not present

## 2017-06-16 DIAGNOSIS — E039 Hypothyroidism, unspecified: Secondary | ICD-10-CM | POA: Diagnosis not present

## 2017-06-16 DIAGNOSIS — Y999 Unspecified external cause status: Secondary | ICD-10-CM | POA: Insufficient documentation

## 2017-06-16 DIAGNOSIS — S9002XA Contusion of left ankle, initial encounter: Secondary | ICD-10-CM | POA: Diagnosis not present

## 2017-06-16 DIAGNOSIS — W07XXXA Fall from chair, initial encounter: Secondary | ICD-10-CM | POA: Insufficient documentation

## 2017-06-16 DIAGNOSIS — Z79899 Other long term (current) drug therapy: Secondary | ICD-10-CM | POA: Insufficient documentation

## 2017-06-16 DIAGNOSIS — Y929 Unspecified place or not applicable: Secondary | ICD-10-CM | POA: Diagnosis not present

## 2017-06-17 ENCOUNTER — Encounter (HOSPITAL_COMMUNITY): Payer: Self-pay | Admitting: Emergency Medicine

## 2017-06-17 ENCOUNTER — Emergency Department (HOSPITAL_COMMUNITY)
Admission: EM | Admit: 2017-06-17 | Discharge: 2017-06-17 | Disposition: A | Discharge status: Home / Self Care | Payer: Medicare Other | Attending: Emergency Medicine | Admitting: Emergency Medicine

## 2017-06-17 ENCOUNTER — Emergency Department (HOSPITAL_COMMUNITY): Payer: Medicare Other

## 2017-06-17 DIAGNOSIS — S9002XA Contusion of left ankle, initial encounter: Secondary | ICD-10-CM

## 2017-06-17 DIAGNOSIS — W19XXXA Unspecified fall, initial encounter: Secondary | ICD-10-CM

## 2017-06-17 NOTE — ED Provider Notes (Signed)
[facility]-EMERGENCY DEPT Provider Note   CSN: [id number] Arrival date & time: 06/16/17  2330     History   Chief Complaint Chief Complaint  Patient presents with  . Fall  . Ankle Pain    HPI Megan Walton is a 46 y.o. female.  Patient is hre for evaluation of left ankle swelling after a mechanical fall yesterday. Per caregiver (patient is nonverbal at baseline) she was sitting on a shower chair getting out of the shower when the chair fell forward. The caregiver did not suspect any injury. The patient did not cry or show any sign of pain. She did not hit her head. There was no wound or bleeding. She has been ambulatory since the fall per her usual. The caregiver noticed that the left ankle was mildly swollen and red so brought her here for possible injury.   The history is provided by a caregiver.  Fall   Ankle Pain      Past Medical History:  Diagnosis Date  . Constipation, chronic   . Developmental delay   . Foot fracture   . Herpes simplex type 1 antibody positive   . Hypothyroidism   . Mental retardation   . Mental retardation   . 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 Osteopenia    Right Hip  . Rett syndrome   . Rett's syndrome   . Rett's syndrome   . Seizures (HCC)   . Thyroid disease     Patient Active Problem List   Diagnosis Date Noted  . 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 No pertinent surgical history.  OB History    Gravida Para Term Preterm AB Living   0             SAB TAB Ectopic Multiple Live Births                   Home Medications    Prior to Admission medications   Medication Sig Start Date End Date Taking? Authorizing Provider  acyclovir (ZOVIRAX) 400 MG tablet Take 400 mg by mouth 2 (two) times daily.    [provider]  calcium carbonate (TUMS - DOSED IN MG  ELEMENTAL CALCIUM) 500 MG chewable tablet Chew 1 tablet by mouth daily.    [provider]  carbamazepine (TEGRETOL) 100 MG chewable tablet Chew 200-300 mg by mouth 4 (four) times daily. Patient takes 300mg  at 7am and 8pm and 200mg  11am and 4pm    [provider]  cetaphil (CETAPHIL) cream Apply 1 application topically daily.    [provider]  Cholecalciferol (VITAMIN D PO) Take 2,000 Units by mouth daily.     [provider]  Docosanol (ABREVA) 10 % CREA Apply topically daily.    [provider]  levETIRAcetam (KEPPRA) 1000 MG tablet Take by mouth. Take 1000 mg in the morning, 1250 mg at bedtime.    [provider]  levOCARNitine (CARNITOR) 330 MG tablet Take 330 mg by mouth 3 (three) times daily. Take 2 tablets 3 x day.    [provider]  levothyroxine (SYNTHROID, LEVOTHROID) 50 MCG tablet Take 50 mcg by mouth once a week.     [provider]  phenytoin (DILANTIN) 50 MG tablet Chew by mouth 3 (three) times daily.    [provider]  polyethylene glycol (MIRALAX / GLYCOLAX) packet Take 17  g by mouth at bedtime. Mix 17 gram in juice or water at bedtime    [provider]  senna (SENOKOT) 8.6 MG TABS Take 1 tablet by mouth 2 (two) times daily.    [provider]    Family History Family History  Problem Relation Age of Onset  . Lymphoma Father   . Healthy Mother   . Healthy Sister   . ADD / ADHD Brother   . Healthy Brother     Social History Social History   Tobacco Use  . Smoking status: Never Smoker  . Smokeless tobacco: Never Used  Substance Use Topics  . Alcohol use: No  . Drug use: No     Allergies   Biaxin [clarithromycin]; Cephalexin; Clarithromycin; Divalproex sodium; Keflex [cephalexin]; Macrolides and ketolides; and Penicillins   Review of Systems Review of Systems  Constitutional: Negative for chills and fever.  Gastrointestinal: Negative for vomiting.    Musculoskeletal: Positive for joint swelling.       See HPI.  Skin: Positive for color change. Negative for wound.  Neurological: Negative.      Physical Exam Updated Vital Signs Pulse 87   Resp 18   SpO2 99%   Physical Exam  Constitutional: She is oriented to person, place, and time. She appears well-developed and well-nourished.  Neck: Normal range of motion.  Pulmonary/Chest: Effort normal.  Musculoskeletal:  Left ankle minimally swollen. Not apparently tender, patient does not pull back or react to any palpation or manipulation of the ankle.   Neurological: She is alert and oriented to person, place, and time.  Skin: Skin is warm and dry.  No wound.     ED Treatments / Results  Labs (all labs ordered are listed, but only abnormal results are displayed) Labs Reviewed - No data to display  EKG  EKG Interpretation None       Radiology Dg Ankle Complete Left  Result Date: 06/17/2017 CLINICAL DATA:  46 y/o  F; twisting injury with pain. EXAM: LEFT ANKLE COMPLETE - 3+ VIEW COMPARISON:  None. FINDINGS: There is no evidence of fracture, dislocation, or joint effusion. Talar dome is intact. Ankle mortise is symmetric on these nonstress views. IMPRESSION: No acute fracture or dislocation identified. Electronically Signed   By: Mitzi HansenLance  Furusawa-Stratton M.D.   On: 06/17/2017 01:34    Procedures Procedures (including critical care time)  Medications Ordered in ED Medications - No data to display   Initial Impression / Assessment and Plan / ED Course  I have reviewed the triage vital signs and the nursing notes.  Pertinent labs & imaging results that were available during my care of the patient were reviewed by me and considered in my medical decision making (see chart for details).     Patient here for evaluation of possible ankle injury after fall yesterday. Exam reassuring. Imaging negative for fracture. The patient has been ambulatory without difficulty. She can  be discharged home with prn PCP follow up.  Final Clinical Impressions(s) / ED Diagnoses   Final diagnoses:  None   1. Fall 2. Ankle contusion, left  ED Discharge Orders    None       Elpidio AnisUpstill, Arwa Yero, PA-C 06/17/17 0408    Ward, Layla MawKristen N, DO 06/17/17 0600

## 2017-06-17 NOTE — ED Triage Notes (Signed)
Patient here from home with complaints of left ankle pain and swelling. Reports fall today from shower chair.

## 2017-07-27 ENCOUNTER — Telehealth: Payer: Self-pay | Admitting: *Deleted

## 2017-07-27 ENCOUNTER — Telehealth: Payer: Self-pay | Admitting: Neurology

## 2017-07-27 NOTE — Telephone Encounter (Signed)
I called the mother.  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  The mother will call back with the telephone number to fax the report to.

## 2017-07-27 NOTE — Telephone Encounter (Signed)
Dr. Anne HahnWillis- please advise. Are you able to help?  Per Arman Bogusana J- Her mother states she was a patient of Dr. Anne HahnWillis for years prior to that last visit and he helped to diagnose her and the information she needs may be in some notes that Dr. [facility]Willis obtained from [facility]

## 2017-07-27 NOTE — Telephone Encounter (Signed)
This patient has a diagnosis of Rett syndrome, she had genetic testing through this office sometime around 2012 or 2013, we will need medical records from 2010 to 2014, I will need to review these.  The genetic testing was done through [facility], I will need to look at the results of the genetic test for the MECP2 gene.

## 2017-07-27 NOTE — Telephone Encounter (Signed)
Please call.   Wants to discuss patients diagnosis.  She needs to know the name of the mutation (gene) "strain", etc.  Typical or atypical?

## 2017-07-28 NOTE — Telephone Encounter (Signed)
Pts mother called with fax number [phone] if possible please send it before noon

## 2017-07-28 NOTE — Telephone Encounter (Signed)
Gave genetic report to [employer]. In medical records to process for mother

## 2017-08-02 ENCOUNTER — Other Ambulatory Visit: Payer: Self-pay | Admitting: Family Medicine

## 2017-08-02 DIAGNOSIS — N6320 Unspecified lump in the left breast, unspecified quadrant: Secondary | ICD-10-CM

## 2017-08-18 ENCOUNTER — Ambulatory Visit
Admission: RE | Admit: 2017-08-18 | Discharge: 2017-08-18 | Disposition: A | Discharge status: Home / Self Care | Payer: Medicare Other | Source: Ambulatory Visit | Attending: Family Medicine | Admitting: Family Medicine

## 2017-08-18 ENCOUNTER — Other Ambulatory Visit: Payer: Self-pay | Admitting: Family Medicine

## 2017-08-18 ENCOUNTER — Encounter: Payer: Medicare Other | Admitting: Gynecology

## 2017-08-18 DIAGNOSIS — N6320 Unspecified lump in the left breast, unspecified quadrant: Secondary | ICD-10-CM

## 2017-08-18 DIAGNOSIS — Z1239 Encounter for other screening for malignant neoplasm of breast: Secondary | ICD-10-CM

## 2017-09-06 ENCOUNTER — Encounter: Payer: Self-pay | Admitting: Gynecology

## 2017-09-06 ENCOUNTER — Ambulatory Visit (INDEPENDENT_AMBULATORY_CARE_PROVIDER_SITE_OTHER): Payer: Medicaid Other | Admitting: Gynecology

## 2017-09-06 VITALS — BP 118/74

## 2017-09-06 DIAGNOSIS — Z01419 Encounter for gynecological examination (general) (routine) without abnormal findings: Secondary | ICD-10-CM

## 2017-09-06 DIAGNOSIS — Z0000 Encounter for general adult medical examination without abnormal findings: Secondary | ICD-10-CM

## 2017-09-06 NOTE — Progress Notes (Signed)
    Megan FriedShannon E Choyce 07-Jul-1971 [id number]        47 y.o.  G0P0 for breast and pelvic exam.  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  Reported to have no menses by her attendant.  Past medical history,surgical history, problem list, medications, allergies, family history and social history were all reviewed and documented as reviewed in the EPIC chart.  ROS:  Performed with pertinent positives and negatives included in the history, assessment and plan.   Additional significant findings : None   Exam: Kennon PortelaKim Gardner assistant Vitals:   09/06/17 0958  BP: 118/74   There is no height or weight on file to calculate BMI.  General appearance: With mental retardation and spastic cerebral palsy.  No acute distress Skin: Grossly normal HEENT: Without gross lesions.  No cervical or supraclavicular adenopathy. Thyroid normal.  Lungs:  Clear without wheezing, rales or rhonchi Cardiac: RR, without RMG Abdominal:  Soft, nontender, without masses, guarding, rebound, organomegaly or hernia Breasts:  Without masses, retractions, discharge or axillary adenopathy. Pelvic:  Ext, BUS, Vagina: Virginal limiting exam  Cervix: Unable to visualize but palpates normal  Uterus: Difficult to palpate but no gross masses or tenderness  Adnexa: Without gross masses or tenderness    Anus and perineum: Normal   Rectovaginal: Normal sphincter tone without palpated masses or tenderness.    Assessment/Plan:  47 y.o. G0P0 female for breast and pelvic exam  1. For annual exam which was normal from a gynecologic standpoint.  Exam confirms virginal status.  Pap smear 2015.  No Pap smear done today.  Mammogram done this month.  Breast exam normal.  No longer having menses.  They were getting sporadic last year consistent with a menopausal status.  As long as no further bleeding then will hold on any workup.  Will defer any further testing to  her primary physician as they feel appropriate.  Follow-up in 1 year, sooner as needed.   Dara Lordsimothy P Coolidge Gossard MD, 10:13 AM 09/06/2017

## 2017-09-06 NOTE — Patient Instructions (Signed)
Follow-up in 1 year for annual exam 

## 2017-09-07 ENCOUNTER — Telehealth: Payer: Self-pay | Admitting: *Deleted

## 2017-09-07 NOTE — Telephone Encounter (Signed)
Megan Hammanshonda English, RN from [facility] called asking if patient will receive another pap smear? Last pap done in 2015. If not Megan Walton states patient will need a letter stating no further pap needed to keep on file. Please advise

## 2017-09-08 ENCOUNTER — Encounter: Payer: Self-pay | Admitting: *Deleted

## 2017-09-08 NOTE — Telephone Encounter (Signed)
Spoke with Judeen Hammanshonda English RN and letter will be faxed to [phone].

## 2017-09-08 NOTE — Telephone Encounter (Signed)
I did not do a Pap smear this year.  I think it can be argued as to whether to continue with Pap smears or not, in Megan Walton's particular situation.  She is virginal and therefore the likelihood that she would have an abnormal Pap smear would be very low.  If she would have an atypical Pap smear, that may or may not be of any significance, it would warrant follow-up evaluation to include possible colposcopy which would be impossible unless she is under general anesthesia.  I think at this point as she is virginal and having no complaints as far as bleeding or other vaginal issues that I am comfortable not doing a Pap smear.  I do think this is an issue that should be readdressed annually at the time of her exam and I would recommend continuing annual gynecologic evaluations for now.

## 2018-02-14 ENCOUNTER — Other Ambulatory Visit: Payer: Self-pay | Admitting: Family Medicine

## 2018-02-14 ENCOUNTER — Ambulatory Visit
Admission: RE | Admit: 2018-02-14 | Discharge: 2018-02-14 | Disposition: A | Discharge status: Home / Self Care | Payer: Medicare Other | Source: Ambulatory Visit | Attending: Family Medicine | Admitting: Family Medicine

## 2018-02-14 DIAGNOSIS — N6320 Unspecified lump in the left breast, unspecified quadrant: Secondary | ICD-10-CM

## 2018-08-18 ENCOUNTER — Other Ambulatory Visit: Payer: Self-pay | Admitting: Gynecology

## 2018-08-18 DIAGNOSIS — N6320 Unspecified lump in the left breast, unspecified quadrant: Secondary | ICD-10-CM

## 2018-08-21 ENCOUNTER — Ambulatory Visit
Admission: RE | Admit: 2018-08-21 | Discharge: 2018-08-21 | Disposition: A | Discharge status: Home / Self Care | Payer: Medicare Other | Source: Ambulatory Visit | Attending: Family Medicine | Admitting: Family Medicine

## 2018-08-21 ENCOUNTER — Ambulatory Visit
Admission: RE | Admit: 2018-08-21 | Discharge: 2018-08-21 | Disposition: A | Discharge status: Home / Self Care | Payer: Medicare Other | Source: Ambulatory Visit | Attending: Gynecology | Admitting: Gynecology

## 2018-08-21 ENCOUNTER — Other Ambulatory Visit: Payer: Self-pay | Admitting: Family Medicine

## 2018-08-21 ENCOUNTER — Other Ambulatory Visit: Payer: Self-pay | Admitting: Gynecology

## 2018-08-21 DIAGNOSIS — N6320 Unspecified lump in the left breast, unspecified quadrant: Secondary | ICD-10-CM

## 2018-09-07 ENCOUNTER — Ambulatory Visit (INDEPENDENT_AMBULATORY_CARE_PROVIDER_SITE_OTHER): Payer: Medicare Other | Admitting: Gynecology

## 2018-09-07 ENCOUNTER — Encounter: Payer: Self-pay | Admitting: Gynecology

## 2018-09-07 VITALS — BP 110/60

## 2018-09-07 DIAGNOSIS — Z01419 Encounter for gynecological examination (general) (routine) without abnormal findings: Secondary | ICD-10-CM | POA: Diagnosis not present

## 2018-09-07 DIAGNOSIS — Z124 Encounter for screening for malignant neoplasm of cervix: Secondary | ICD-10-CM

## 2018-09-07 NOTE — Patient Instructions (Signed)
Follow up in 1 year.

## 2018-09-07 NOTE — Addendum Note (Signed)
Addended by: Dayna Barker on: 09/07/2018 10:54 AM   Modules accepted: Orders

## 2018-09-07 NOTE — Progress Notes (Signed)
    Megan Walton Apr 14, 1971 [id number]        48 y.o.  G0P0 for breast and pelvic exam.  Megan Walton has a history of Rett's syndrome with epileptic seizures, bowel and bladder incontinence and profound mental retardation.  No issues reported by the attendant.  No further menses  Past medical history,surgical history, problem list, medications, allergies, family history and social history were all reviewed and documented as reviewed in the EPIC chart.  ROS:  Performed with pertinent positives and negatives included in the history, assessment and plan.   Additional significant findings : None   Exam: Kennon Portela assistant Vitals:   09/07/18 1001  BP: 110/60   There is no height or weight on file to calculate BMI.  General appearance:  Normal affect, orientation and appearance. Skin: Grossly normal HEENT: Without gross lesions.  No cervical or supraclavicular adenopathy. Thyroid normal.  Lungs:  Clear without wheezing, rales or rhonchi Cardiac: RR, without RMG Abdominal:  Soft, nontender, without masses, guarding, rebound, organomegaly or hernia Breasts:  Examined lying and sitting without masses, retractions, discharge or axillary adenopathy. Pelvic:  Ext, BUS, Vagina: Virginal with limited exam.  Pap smear of upper vaginal vault done  Cervix: Unable to visualize  Uterus: Difficult to palpate but no gross masses or tenderness  Adnexa: Without masses or tenderness    Anus and perineum: Normal   Rectovaginal: Normal sphincter tone without palpated masses or tenderness.    Assessment/Plan:  48 y.o. G0P0 female for breast and pelvic exam  1. Perimenopausal.  Attendant reports no longer having menses.  Will report any bleeding. 2. Pap smear 2015.  Pap smear of vaginal vault done today.  Cervix unable to visualize or examine due to virginal status. 3. Mammography 08/2018.  Breast exam normal today. 4. Health maintenance.  No testing done today as this will be left to her primary  physicians discretion.   Dara Lords MD, 10:39 AM 09/07/2018

## 2018-09-09 LAB — PAP IG W/ RFLX HPV ASCU

## 2019-04-04 ENCOUNTER — Encounter: Payer: Self-pay | Admitting: Gynecology

## 2019-07-19 ENCOUNTER — Other Ambulatory Visit: Payer: Self-pay | Admitting: Family Medicine

## 2019-07-19 DIAGNOSIS — Z1231 Encounter for screening mammogram for malignant neoplasm of breast: Secondary | ICD-10-CM

## 2019-09-03 ENCOUNTER — Other Ambulatory Visit: Payer: Self-pay

## 2019-09-03 ENCOUNTER — Ambulatory Visit
Admission: RE | Admit: 2019-09-03 | Discharge: 2019-09-03 | Disposition: A | Discharge status: Home / Self Care | Payer: Medicare Other | Source: Ambulatory Visit | Attending: Family Medicine | Admitting: Family Medicine

## 2019-09-03 DIAGNOSIS — Z1231 Encounter for screening mammogram for malignant neoplasm of breast: Secondary | ICD-10-CM

## 2020-04-17 ENCOUNTER — Other Ambulatory Visit (HOSPITAL_COMMUNITY): Payer: Self-pay | Admitting: Family Medicine

## 2020-04-17 DIAGNOSIS — R1310 Dysphagia, unspecified: Secondary | ICD-10-CM

## 2020-04-24 ENCOUNTER — Ambulatory Visit (HOSPITAL_COMMUNITY): Admission: RE | Admit: 2020-04-24 | Payer: Medicare Other | Source: Ambulatory Visit

## 2021-06-24 ENCOUNTER — Other Ambulatory Visit: Payer: Self-pay | Admitting: Family Medicine

## 2021-06-24 DIAGNOSIS — Z1231 Encounter for screening mammogram for malignant neoplasm of breast: Secondary | ICD-10-CM

## 2021-07-28 ENCOUNTER — Ambulatory Visit: Payer: Medicare Other

## 2022-02-01 DIAGNOSIS — Z151 Genetic susceptibility to epilepsy and neurodevelopmental disorders: Secondary | ICD-10-CM | POA: Diagnosis present

## 2022-02-01 DIAGNOSIS — G40919 Epilepsy, unspecified, intractable, without status epilepticus: Secondary | ICD-10-CM | POA: Diagnosis present

## 2022-07-23 ENCOUNTER — Other Ambulatory Visit (HOSPITAL_BASED_OUTPATIENT_CLINIC_OR_DEPARTMENT_OTHER): Payer: Medicare Other

## 2022-07-23 ENCOUNTER — Emergency Department (HOSPITAL_BASED_OUTPATIENT_CLINIC_OR_DEPARTMENT_OTHER)
Admission: EM | Admit: 2022-07-23 | Discharge: 2022-07-23 | Disposition: A | Discharge status: Home / Self Care | Payer: Medicare Other | Attending: Emergency Medicine | Admitting: Emergency Medicine

## 2022-07-23 ENCOUNTER — Emergency Department (HOSPITAL_BASED_OUTPATIENT_CLINIC_OR_DEPARTMENT_OTHER): Payer: Medicare Other

## 2022-07-23 ENCOUNTER — Other Ambulatory Visit: Payer: Self-pay

## 2022-07-23 ENCOUNTER — Encounter (HOSPITAL_BASED_OUTPATIENT_CLINIC_OR_DEPARTMENT_OTHER): Payer: Self-pay | Admitting: Emergency Medicine

## 2022-07-23 DIAGNOSIS — B349 Viral infection, unspecified: Secondary | ICD-10-CM | POA: Insufficient documentation

## 2022-07-23 DIAGNOSIS — Z20822 Contact with and (suspected) exposure to covid-19: Secondary | ICD-10-CM | POA: Insufficient documentation

## 2022-07-23 DIAGNOSIS — R634 Abnormal weight loss: Secondary | ICD-10-CM | POA: Diagnosis present

## 2022-07-23 LAB — BASIC METABOLIC PANEL
Anion gap: 12 (ref 5–15)
BUN: 7 mg/dL (ref 6–20)
CO2: 24 mmol/L (ref 22–32)
Calcium: 9.5 mg/dL (ref 8.9–10.3)
Chloride: 104 mmol/L (ref 98–111)
Creatinine, Ser: 0.49 mg/dL (ref 0.44–1.00)
GFR, Estimated: >60 mL/min (ref >60)
Glucose, Bld: 112 mg/dL — ABNORMAL HIGH (ref 70–99)
Potassium: 4.1 mmol/L (ref 3.5–5.1)
Sodium: 140 mmol/L (ref 135–145)

## 2022-07-23 LAB — CBC
HCT: 40.6 % (ref 36.0–46.0)
Hemoglobin: 14 g/dL (ref 12.0–15.0)
MCH: 34.1 pg — ABNORMAL HIGH (ref 26.0–34.0)
MCHC: 34.5 g/dL (ref 30.0–36.0)
MCV: 99 fL (ref 80.0–100.0)
Platelets: 220 10*3/uL (ref 150–400)
RBC: 4.1 MIL/uL (ref 3.87–5.11)
RDW: 12.3 % (ref 11.5–15.5)
WBC: 12.5 10*3/uL — ABNORMAL HIGH (ref 4.0–10.5)
nRBC: 0 % (ref 0.0–0.2)

## 2022-07-23 LAB — RESP PANEL BY RT-PCR (RSV, FLU A&B, COVID)  RVPGX2
Influenza A by PCR: NEGATIVE
Influenza B by PCR: NEGATIVE
Resp Syncytial Virus by PCR: NEGATIVE
SARS Coronavirus 2 by RT PCR: NEGATIVE

## 2022-07-23 LAB — MAGNESIUM: Magnesium: 2 mg/dL (ref 1.7–2.4)

## 2022-07-23 MED ORDER — SODIUM CHLORIDE 0.9 % IV BOLUS
1000.0000 mL | Freq: Once | INTRAVENOUS | Status: AC
  Administered 2022-07-23: 1000 mL via INTRAVENOUS

## 2022-07-23 NOTE — ED Provider Notes (Signed)
[facility] EMERGENCY DEPT Provider Note   CSN: [id number] Arrival date & time: 07/23/22  1411     History  No chief complaint on file.   BRIGITTE SODERBERG is a 52 y.o. female w/ hx of RETT syndrome presenting from group home with concern for weight loss, diarrhea, poor appetite  HPI     Home Medications Prior to Admission medications   Medication Sig Start Date End Date Taking? Authorizing Provider  acyclovir (ZOVIRAX) 400 MG tablet Take 400 mg by mouth 2 (two) times daily.    [provider]  calcium carbonate (TUMS - DOSED IN MG ELEMENTAL CALCIUM) 500 MG chewable tablet Chew 1 tablet by mouth daily.    [provider]  carbamazepine (TEGRETOL) 100 MG chewable tablet Chew 200-300 mg by mouth 4 (four) times daily. Patient takes 300mg  at 7am and 8pm and 200mg  11am and 4pm    [provider]  cetaphil (CETAPHIL) cream Apply 1 application topically daily.    [provider]  Cholecalciferol (VITAMIN D PO) Take 2,000 Units by mouth daily.     [provider]  Docosanol (ABREVA) 10 % CREA Apply topically daily.    [provider]  levETIRAcetam (KEPPRA) 1000 MG tablet Take by mouth. Take 1000 mg in the morning, 1250 mg at bedtime.    [provider]  levOCARNitine (CARNITOR) 330 MG tablet Take 330 mg by mouth 3 (three) times daily. Take 2 tablets 3 x day.    [provider]  levothyroxine (SYNTHROID, LEVOTHROID) 50 MCG tablet Take 50 mcg by mouth once a week.     [provider]  phenytoin (DILANTIN) 50 MG tablet Chew by mouth 3 (three) times daily.    [provider]  polyethylene glycol (MIRALAX / GLYCOLAX) packet Take 17 g by mouth at bedtime. Mix 17 gram in juice or water at bedtime    [provider]  senna (SENOKOT) 8.6 MG TABS Take 1 tablet by mouth 2 (two) times daily.    [provider]      Allergies    Biaxin [clarithromycin], Cephalexin, Clarithromycin,  Divalproex sodium, Keflex [cephalexin], Macrolides and ketolides, and Penicillins    Review of Systems   Review of Systems  Physical Exam Updated Vital Signs BP 100/70   Pulse 88   Temp 97.8 F (36.6 C) (Temporal)   Resp 20   Ht 5' (1.524 m)   Wt 40.8 kg   SpO2 97%   BMI 17.58 kg/m  Physical Exam Constitutional:      General: She is not in acute distress.    Comments: Nonverbal, thin, frail  HENT:     Head: Normocephalic and atraumatic.  Eyes:     Conjunctiva/sclera: Conjunctivae normal.     Pupils: Pupils are equal, round, and reactive to light.  Cardiovascular:     Rate and Rhythm: Normal rate and regular rhythm.  Pulmonary:     Effort: Pulmonary effort is normal. No respiratory distress.  Abdominal:     General: There is no distension.     Tenderness: There is no abdominal tenderness.  Skin:    General: Skin is warm and dry.  Neurological:     General: No focal deficit present.     Mental Status: She is alert. Mental status is at baseline.     ED Results / Procedures / Treatments   Labs (all labs ordered are listed, but only abnormal results are displayed) Labs Reviewed  BASIC METABOLIC PANEL - Abnormal;  Notable for the following components:      Result Value   Glucose, Bld 112 (*)    All other components within normal limits  CBC - Abnormal; Notable for the following components:   WBC 12.5 (*)    MCH 34.1 (*)    All other components within normal limits  RESP PANEL BY RT-PCR (RSV, FLU A&B, COVID)  RVPGX2  MAGNESIUM    EKG None  Radiology DG Chest Portable 1 View  Result Date: 07/23/2022 CLINICAL DATA:  Productive cough EXAM: PORTABLE CHEST 1 VIEW COMPARISON:  10/08/2011 FINDINGS: Heart and mediastinal contours are within normal limits. No focal opacities or effusions. No acute bony abnormality. IMPRESSION: No active disease. Electronically Signed   By: Rolm Baptise M.D.   On: 07/23/2022 17:15    Procedures Procedures    Medications Ordered in  ED Medications  sodium chloride 0.9 % bolus 1,000 mL (0 mLs Intravenous Stopped 07/23/22 1638)    ED Course/ Medical Decision Making/ A&P                             Medical Decision Making Amount and/or Complexity of Data Reviewed Labs: ordered. Radiology: ordered.   Ddx includes viral illness vs food borne illness vs other  Hx provided by mother and group home nurse at bedside  Patient is nonverbal, in no distress, no abdominal tenderness  Labs ordered for hydration/electrolyte assessment, and personally reviewed, notable for mild leukocytosis, no other significant abnormalities.  Leukocytosis may be reactive from diarrhea and vomiting.  IV fluids ordered for hydration Patient tolerating PO fluids easily, drank coke in the Ed  X-ray of chest was ordered and personally interpreted no focal infiltrate.  No indication for emergent CT imaging of abdomen at this time; doubt surgical emergency  Suspect this is a viral syndrome.  AdviseD continued supportive care for a week, hydrating patient, Tylenol and ibuprofen as needed.  Mother and caretaker at bedside verbalized understanding        Final Clinical Impression(s) / ED Diagnoses Final diagnoses:  Viral illness    Rx / DC Orders ED Discharge Orders     None         Benedicto Capozzi, Carola Rhine, MD 07/23/22 2002

## 2022-07-23 NOTE — ED Triage Notes (Signed)
Pt arrives to ED from group home with c/o diarrhea that started x2 days ago and cough that started yesterday.

## 2022-10-08 ENCOUNTER — Other Ambulatory Visit: Payer: Self-pay

## 2022-10-08 ENCOUNTER — Encounter (HOSPITAL_COMMUNITY): Payer: Self-pay

## 2022-10-08 ENCOUNTER — Emergency Department (HOSPITAL_COMMUNITY)
Admission: EM | Admit: 2022-10-08 | Discharge: 2022-10-08 | Disposition: A | Discharge status: Home / Self Care | Payer: Medicare Other | Attending: Emergency Medicine | Admitting: Emergency Medicine

## 2022-10-08 ENCOUNTER — Emergency Department (HOSPITAL_COMMUNITY): Payer: Medicare Other

## 2022-10-08 DIAGNOSIS — R0602 Shortness of breath: Secondary | ICD-10-CM | POA: Insufficient documentation

## 2022-10-08 DIAGNOSIS — R059 Cough, unspecified: Secondary | ICD-10-CM | POA: Diagnosis present

## 2022-10-08 DIAGNOSIS — Z1152 Encounter for screening for COVID-19: Secondary | ICD-10-CM | POA: Diagnosis not present

## 2022-10-08 DIAGNOSIS — R051 Acute cough: Secondary | ICD-10-CM | POA: Diagnosis not present

## 2022-10-08 LAB — CBC WITH DIFFERENTIAL/PLATELET
Abs Immature Granulocytes: 0.02 10*3/uL (ref 0.00–0.07)
Basophils Absolute: 0.1 10*3/uL (ref 0.0–0.1)
Basophils Relative: 1 %
Eosinophils Absolute: 0.1 10*3/uL (ref 0.0–0.5)
Eosinophils Relative: 1 %
HCT: 38.5 % (ref 36.0–46.0)
Hemoglobin: 13.4 g/dL (ref 12.0–15.0)
Immature Granulocytes: 0 %
Lymphocytes Relative: 37 %
Lymphs Abs: 2.9 10*3/uL (ref 0.7–4.0)
MCH: 34.4 pg — ABNORMAL HIGH (ref 26.0–34.0)
MCHC: 34.8 g/dL (ref 30.0–36.0)
MCV: 99 fL (ref 80.0–100.0)
Monocytes Absolute: 0.7 10*3/uL (ref 0.1–1.0)
Monocytes Relative: 9 %
Neutro Abs: 4.1 10*3/uL (ref 1.7–7.7)
Neutrophils Relative %: 52 %
Platelets: 239 10*3/uL (ref 150–400)
RBC: 3.89 MIL/uL (ref 3.87–5.11)
RDW: 12.2 % (ref 11.5–15.5)
WBC: 7.9 10*3/uL (ref 4.0–10.5)
nRBC: 0 % (ref 0.0–0.2)

## 2022-10-08 LAB — COMPREHENSIVE METABOLIC PANEL
ALT: 15 U/L (ref 0–44)
AST: 23 U/L (ref 15–41)
Albumin: 3.8 g/dL (ref 3.5–5.0)
Alkaline Phosphatase: 70 U/L (ref 38–126)
Anion gap: 10 (ref 5–15)
BUN: 11 mg/dL (ref 6–20)
CO2: 19 mmol/L — ABNORMAL LOW (ref 22–32)
Calcium: 8.9 mg/dL (ref 8.9–10.3)
Chloride: 106 mmol/L (ref 98–111)
Creatinine, Ser: 0.57 mg/dL (ref 0.44–1.00)
GFR, Estimated: >60 mL/min (ref >60)
Glucose, Bld: 93 mg/dL (ref 70–99)
Potassium: 3.3 mmol/L — ABNORMAL LOW (ref 3.5–5.1)
Sodium: 135 mmol/L (ref 135–145)
Total Bilirubin: 0.5 mg/dL (ref 0.3–1.2)
Total Protein: 6.8 g/dL (ref 6.5–8.1)

## 2022-10-08 LAB — BRAIN NATRIURETIC PEPTIDE: B Natriuretic Peptide: 17.3 pg/mL (ref 0.0–100.0)

## 2022-10-08 LAB — RESP PANEL BY RT-PCR (RSV, FLU A&B, COVID)  RVPGX2
Influenza A by PCR: NEGATIVE
Influenza B by PCR: NEGATIVE
Resp Syncytial Virus by PCR: NEGATIVE
SARS Coronavirus 2 by RT PCR: NEGATIVE

## 2022-10-08 LAB — TROPONIN I (HIGH SENSITIVITY): Troponin I (High Sensitivity): <2 ng/L (ref <18)

## 2022-10-08 LAB — D-DIMER, QUANTITATIVE: D-Dimer, Quant: <0.27 ug/mL-FEU (ref 0.00–0.50)

## 2022-10-08 MED ORDER — POTASSIUM CHLORIDE 20 MEQ PO PACK
20.0000 meq | PACK | Freq: Once | ORAL | Status: AC
  Administered 2022-10-08: 20 meq via ORAL
  Filled 2022-10-08: qty 1

## 2022-10-08 MED ORDER — POTASSIUM CHLORIDE CRYS ER 20 MEQ PO TBCR: 20.0000 meq | EXTENDED_RELEASE_TABLET | Freq: Once | ORAL | Status: DC

## 2022-10-08 NOTE — ED Provider Notes (Signed)
[place] EMERGENCY DEPARTMENT AT [facility] Provider Note   CSN: [id number] Arrival date & time: 10/08/22  1847     History  Chief Complaint  Patient presents with   Shortness of Breath   Cough   Level 5 caveat: Patient nonverbal [facility] is a 52 y.o. female with a PMHx of Rett syndrome, who presents to the emergency department BIB EMS with concerns for shortness of breath.  Family and caretaker at bedside notes that patient was diagnosed with COVID 3 weeks ago and has had a cough since.  Notes the cough has progressively worsened and is now yellow discharge.  Caregiver notes that patient does not seem to be acting herself today due to her sitting up which is unlike her.     The history is provided by the patient. No language interpreter was used.       Home Medications Prior to Admission medications   Medication Sig Start Date End Date Taking? Authorizing Provider  acyclovir (ZOVIRAX) 400 MG tablet Take 400 mg by mouth 2 (two) times daily.    [provider]  calcium carbonate (TUMS - DOSED IN MG ELEMENTAL CALCIUM) 500 MG chewable tablet Chew 1 tablet by mouth daily.    [provider]  carbamazepine (TEGRETOL) 100 MG chewable tablet Chew 200-300 mg by mouth 4 (four) times daily. Patient takes 300mg  at 7am and 8pm and 200mg  11am and 4pm    [provider]  cetaphil (CETAPHIL) cream Apply 1 application topically daily.    [provider]  Cholecalciferol (VITAMIN D PO) Take 2,000 Units by mouth daily.     [provider]  Docosanol (ABREVA) 10 % CREA Apply topically daily.    [provider]  levETIRAcetam (KEPPRA) 1000 MG tablet Take by mouth. Take 1000 mg in the morning, 1250 mg at bedtime.    [provider]  levOCARNitine (CARNITOR) 330 MG tablet Take 330 mg by mouth 3 (three) times daily. Take 2 tablets 3 x day.    [provider]  levothyroxine (SYNTHROID, LEVOTHROID) 50 MCG tablet  Take 50 mcg by mouth once a week.     [provider]  phenytoin (DILANTIN) 50 MG tablet Chew by mouth 3 (three) times daily.    [provider]  polyethylene glycol (MIRALAX / GLYCOLAX) packet Take 17 g by mouth at bedtime. Mix 17 gram in juice or water at bedtime    [provider]  senna (SENOKOT) 8.6 MG TABS Take 1 tablet by mouth 2 (two) times daily.    [provider]      Allergies    Biaxin [clarithromycin], Cephalexin, Clarithromycin, Divalproex sodium, Keflex [cephalexin], Macrolides and ketolides, and Penicillins    Review of Systems   Review of Systems  Unable to perform ROS: Patient nonverbal  Respiratory:  Positive for cough and shortness of breath.     Physical Exam Updated Vital Signs BP 108/74   Pulse 61   Temp 98.5 F (36.9 C) (Axillary)   Resp 13   Ht 4\' 11"  (1.499 m)   Wt 43.5 kg   SpO2 100%   BMI 19.39 kg/m  Physical Exam Vitals and nursing note reviewed.  Constitutional:      General: She is not in acute distress.    Appearance: She is not diaphoretic.  HENT:     Head: Normocephalic and atraumatic.     Mouth/Throat:     Pharynx: No oropharyngeal exudate.  Eyes:  General: No scleral icterus.    Conjunctiva/sclera: Conjunctivae normal.  Cardiovascular:     Rate and Rhythm: Normal rate and regular rhythm.     Pulses: Normal pulses.     Heart sounds: Normal heart sounds.  Pulmonary:     Effort: Pulmonary effort is normal. No respiratory distress.     Breath sounds: Normal breath sounds. No wheezing.  Abdominal:     General: Bowel sounds are normal.     Palpations: Abdomen is soft. There is no mass.     Tenderness: There is no abdominal tenderness. There is no guarding or rebound.  Musculoskeletal:        General: Normal range of motion.     Cervical back: Normal range of motion and neck supple.  Skin:    General: Skin is warm and dry.  Neurological:     Mental Status: She is alert.  Psychiatric:         Behavior: Behavior normal.     ED Results / Procedures / Treatments   Labs (all labs ordered are listed, but only abnormal results are displayed) Labs Reviewed  CBC WITH DIFFERENTIAL/PLATELET - Abnormal; Notable for the following components:      Result Value   MCH 34.4 (*)    All other components within normal limits  COMPREHENSIVE METABOLIC PANEL - Abnormal; Notable for the following components:   Potassium 3.3 (*)    CO2 19 (*)    All other components within normal limits  RESP PANEL BY RT-PCR (RSV, FLU A&B, COVID)  RVPGX2  BRAIN NATRIURETIC PEPTIDE  D-DIMER, QUANTITATIVE  TROPONIN I (HIGH SENSITIVITY)  TROPONIN I (HIGH SENSITIVITY)    EKG EKG Interpretation  Date/Time:  Friday October 08 2022 19:18:26 EDT Ventricular Rate:  63 PR Interval:  161 QRS Duration: 124 QT Interval:  423 QTC Calculation: 433 R Axis:   96 Text Interpretation: Sinus rhythm Nonspecific intraventricular conduction delay Nonspecific T abnormalities, lateral leads No significant change since last tracing Confirmed by Gareth Morgan [phone]) on 10/08/2022 10:05:16 PM  Radiology DG Chest Port 1 View  Result Date: 10/08/2022 CLINICAL DATA:  Shortness of breath. EXAM: PORTABLE CHEST 1 VIEW COMPARISON:  Chest radiograph dated 07/23/2022. FINDINGS: No focal consolidation, pleural effusion, or pneumothorax. The cardiac silhouette is within normal limits. No acute osseous pathology. IMPRESSION: No active disease. Electronically Signed   By: Anner Crete M.D.   On: 10/08/2022 19:37    Procedures Procedures    Medications Ordered in ED Medications  potassium chloride SA (KLOR-CON M) CR tablet 20 mEq (has no administration in time range)    ED Course/ Medical Decision Making/ A&P Clinical Course as of 10/08/22 2210  Fri Oct 08, 2022  2209 Patient reevaluated and sitting comfortably in bed drinking soda.  Discussed with patient, caregiver, mother at bedside regarding lab and imaging findings.   Discussed discharge treatment plan.  Answered all available questions.  Patient appears safe for discharge at this time. [SB]    Clinical Course User Index [SB] Eugune Sine A, PA-C                             Medical Decision Making Amount and/or Complexity of Data Reviewed Labs: ordered. Radiology: ordered.  Risk Prescription drug management.   Patient presents to the ED complaining of shortness of breath and cough today.  Vital signs pt afebrile, not tachycardic or hypoxic.  On exam patient with no acute cardiovascular, respiratory, abdominal  exam findings. No pitting edema noted bilaterally. Differential diagnosis includes COVID, Flu, RSV, PE, CHF exacerbation, ACS, PTX, PNA.   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  The pertinent results include:   BNP unremarkable CBC without leukocytosis Dimer unremarkable CMP with slightly decreased potassium at 3.3 otherwise unremarkable COVID, flu, RSV swab negative  Imaging: I ordered imaging studies including chest x-ray I independently visualized and interpreted imaging which showed: No acute findings noted on chest x-ray I agree with the radiologist interpretation   Disposition: Presentation suspicious for shortness of breath and likely worsening cough.  Doubt concerns at this time for COVID, flu, RSV, pneumonia.  Doubt concerns at this time for PE or ACS. After consideration of the diagnostic results and the patients response to treatment, I feel that the patient would benefit from Discharge home.  Discussed with patient's mother and caregiver at bedside regarding plans for patient to being discharged back to facility.  Mother agreeable at this time.  Supportive care measures and strict return precautions discussed with patient at bedside. Pt acknowledges and  verbalizes understanding. Pt appears safe for discharge. Follow up as indicated in discharge paperwork.    This chart was dictated using voice recognition software, Dragon. Despite the best efforts of this provider to proofread and correct errors, errors may still occur which can change documentation meaning.   Final Clinical Impression(s) / ED Diagnoses Final diagnoses:  Acute cough  Shortness of breath    Rx / DC Orders ED Discharge Orders     None         Lakela Kuba A, PA-C 10/08/22 2211    Gareth Morgan, MD 10/10/22 [phone]

## 2022-10-08 NOTE — Discharge Instructions (Addendum)
It was a pleasure taking care of you today!  Your labs and imaging studies didn't show any concerning emergent findings tonight. You may take over the counter cough and cold medications as needed for your symptoms. Follow up with your primary care provider regarding todays ED visit. Return to the ED if your symptoms are worsening.

## 2022-10-08 NOTE — ED Triage Notes (Signed)
BIB EMS from [facility] group home. Pt had covid 3 weeks ago and has had a cough since. Pt caregiver states pt is not acting herself today and seemed to be short of breath, breathing "loudly" and not acting herself.

## 2022-12-31 ENCOUNTER — Other Ambulatory Visit: Payer: Self-pay | Admitting: Family Medicine

## 2022-12-31 DIAGNOSIS — Z993 Dependence on wheelchair: Secondary | ICD-10-CM

## 2023-02-08 ENCOUNTER — Ambulatory Visit
Admission: RE | Admit: 2023-02-08 | Discharge: 2023-02-08 | Disposition: A | Discharge status: Home / Self Care | Payer: Medicare Other | Source: Ambulatory Visit | Attending: Family Medicine | Admitting: Family Medicine

## 2023-02-08 ENCOUNTER — Other Ambulatory Visit: Payer: Medicare Other

## 2023-02-08 DIAGNOSIS — Z993 Dependence on wheelchair: Secondary | ICD-10-CM

## 2023-09-27 ENCOUNTER — Emergency Department (HOSPITAL_COMMUNITY)
Admission: EM | Admit: 2023-09-27 | Discharge: 2023-09-27 | Disposition: A | Discharge status: Home / Self Care | Attending: Emergency Medicine | Admitting: Emergency Medicine

## 2023-09-27 ENCOUNTER — Emergency Department (HOSPITAL_COMMUNITY)

## 2023-09-27 ENCOUNTER — Other Ambulatory Visit: Payer: Self-pay

## 2023-09-27 DIAGNOSIS — R059 Cough, unspecified: Secondary | ICD-10-CM | POA: Insufficient documentation

## 2023-09-27 DIAGNOSIS — R051 Acute cough: Secondary | ICD-10-CM

## 2023-09-27 DIAGNOSIS — T18128A Food in esophagus causing other injury, initial encounter: Secondary | ICD-10-CM | POA: Diagnosis not present

## 2023-09-27 DIAGNOSIS — J690 Pneumonitis due to inhalation of food and vomit: Secondary | ICD-10-CM

## 2023-09-27 MED ORDER — CLINDAMYCIN HCL 150 MG PO CAPS: 150.0000 mg | ORAL_CAPSULE | Freq: Four times a day (QID) | ORAL | 0 refills | Status: DC

## 2023-09-27 NOTE — ED Provider Notes (Signed)
 [place] EMERGENCY DEPARTMENT AT [facility] Provider Note   CSN: [id number] Arrival date & time: 09/27/23  1948     History  Chief Complaint  Patient presents with   Cough    Per [facility] patient coming from [facility] for a cough with clear sputum. Patient was given dinner at group home and ever since she has been coughing and they wanted to make sure she didn't aspirate.  Hx cognitive delays     LUBERTHA LEITE is a 53 y.o. female.  53 year old female with history of ADD presents due to possible aspiration.  According to caregiver who is at bedside patient was eating and choked.  Has been coughing since then.  Unknown if patient has had aspiration in the past.  No current fluid restrictions.       Home Medications Prior to Admission medications   Medication Sig Start Date End Date Taking? Authorizing Provider  acyclovir (ZOVIRAX) 400 MG tablet Take 400 mg by mouth 2 (two) times daily.    [provider]  calcium carbonate (TUMS - DOSED IN MG ELEMENTAL CALCIUM) 500 MG chewable tablet Chew 1 tablet by mouth daily.    [provider]  carbamazepine (TEGRETOL) 100 MG chewable tablet Chew 200-300 mg by mouth 4 (four) times daily. Patient takes 300mg  at 7am and 8pm and 200mg  11am and 4pm    [provider]  cetaphil (CETAPHIL) cream Apply 1 application topically daily.    [provider]  Cholecalciferol (VITAMIN D PO) Take 2,000 Units by mouth daily.     [provider]  Docosanol (ABREVA) 10 % CREA Apply topically daily.    [provider]  levETIRAcetam (KEPPRA) 1000 MG tablet Take by mouth. Take 1000 mg in the morning, 1250 mg at bedtime.    [provider]  levOCARNitine (CARNITOR) 330 MG tablet Take 330 mg by mouth 3 (three) times daily. Take 2 tablets 3 x day.    [provider]  levothyroxine (SYNTHROID, LEVOTHROID) 50 MCG tablet Take 50 mcg by mouth once a week.      [provider]  phenytoin (DILANTIN) 50 MG tablet Chew by mouth 3 (three) times daily.    [provider]  polyethylene glycol (MIRALAX / GLYCOLAX) packet Take 17 g by mouth at bedtime. Mix 17 gram in juice or water at bedtime    [provider]  senna (SENOKOT) 8.6 MG TABS Take 1 tablet by mouth 2 (two) times daily.    [provider]      Allergies    Biaxin [clarithromycin], Cephalexin, Clarithromycin, Divalproex sodium, Keflex [cephalexin], Macrolides and ketolides, and Penicillins    Review of Systems   Review of Systems  Unable to perform ROS: Patient nonverbal    Physical Exam Updated Vital Signs BP (!) 152/123 (BP Location: Left Arm)   Pulse 93   Resp 17   SpO2 100%  Physical Exam Vitals and nursing note reviewed.  Constitutional:      General: She is not in acute distress.    Appearance: Normal appearance. She is well-developed. She is not toxic-appearing.  HENT:     Head: Normocephalic and atraumatic.  Eyes:     General: Lids are normal.     Conjunctiva/sclera: Conjunctivae normal.     Pupils: Pupils are equal, round, and reactive to light.  Neck:     Thyroid: No thyroid mass.     Trachea: No tracheal deviation.  Cardiovascular:  Rate and Rhythm: Normal rate and regular rhythm.     Heart sounds: Normal heart sounds. No murmur heard.    No gallop.  Pulmonary:     Effort: Pulmonary effort is normal. No respiratory distress.     Breath sounds: No stridor. Examination of the right-upper field reveals decreased breath sounds. Examination of the left-upper field reveals decreased breath sounds. Decreased breath sounds present. No wheezing, rhonchi or rales.  Abdominal:     General: There is no distension.     Palpations: Abdomen is soft.     Tenderness: There is no abdominal tenderness. There is no rebound.  Musculoskeletal:        General: No tenderness. Normal range of motion.     Cervical back: Normal range of motion and  neck supple.  Skin:    General: Skin is warm and dry.     Findings: No abrasion or rash.  Neurological:     Mental Status: Mental status is at baseline. She is lethargic and disoriented.     GCS: GCS eye subscore is 4. GCS verbal subscore is 5. GCS motor subscore is 6.     Cranial Nerves: No cranial nerve deficit.     Sensory: No sensory deficit.     Motor: Motor function is intact.  Psychiatric:        Attention and Perception: She is inattentive.     ED Results / Procedures / Treatments   Labs (all labs ordered are listed, but only abnormal results are displayed) Labs Reviewed - No data to display  EKG None  Radiology No results found.  Procedures Procedures    Medications Ordered in ED Medications - No data to display  ED Course/ Medical Decision Making/ A&P                                 Medical Decision Making Amount and/or Complexity of Data Reviewed Radiology: ordered.   Patient's chest x-ray shows no acute infiltrate.  Patient at her baseline according to caregiver at bedside.  Concern for possible aspiration will place on Augmentin.  Pulse oximetry is stable.  Will discharge back to her facility.  Return precautions given        Final Clinical Impression(s) / ED Diagnoses Final diagnoses:  None    Rx / DC Orders ED Discharge Orders     None         Lorre Nick, MD 09/27/23 2206

## 2023-09-27 NOTE — ED Notes (Signed)
 Patient d/c back to [facility] with care giver at bedside. Patient assisted into wheelchair and offered assistance to the car and patient care giver politely refused stating "I got it from here"

## 2023-09-27 NOTE — ED Notes (Signed)
Patient resting in hallway bed.

## 2023-09-28 ENCOUNTER — Emergency Department (HOSPITAL_COMMUNITY)

## 2023-09-28 ENCOUNTER — Encounter (HOSPITAL_COMMUNITY): Payer: Self-pay

## 2023-09-28 ENCOUNTER — Inpatient Hospital Stay (HOSPITAL_COMMUNITY)
Admission: EM | Admit: 2023-09-28 | Discharge: 2023-10-02 | DRG: 394 | Disposition: A | Discharge status: Home / Self Care | Attending: Family Medicine | Admitting: Family Medicine

## 2023-09-28 DIAGNOSIS — F842 Rett's syndrome: Secondary | ICD-10-CM | POA: Diagnosis present

## 2023-09-28 DIAGNOSIS — F79 Unspecified intellectual disabilities: Secondary | ICD-10-CM | POA: Diagnosis present

## 2023-09-28 DIAGNOSIS — T18128A Food in esophagus causing other injury, initial encounter: Principal | ICD-10-CM | POA: Diagnosis present

## 2023-09-28 DIAGNOSIS — J101 Influenza due to other identified influenza virus with other respiratory manifestations: Secondary | ICD-10-CM | POA: Diagnosis present

## 2023-09-28 DIAGNOSIS — Z818 Family history of other mental and behavioral disorders: Secondary | ICD-10-CM

## 2023-09-28 DIAGNOSIS — R1319 Other dysphagia: Secondary | ICD-10-CM

## 2023-09-28 DIAGNOSIS — K5909 Other constipation: Secondary | ICD-10-CM | POA: Diagnosis present

## 2023-09-28 DIAGNOSIS — W44F3XA Food entering into or through a natural orifice, initial encounter: Secondary | ICD-10-CM | POA: Diagnosis present

## 2023-09-28 DIAGNOSIS — Z881 Allergy status to other antibiotic agents status: Secondary | ICD-10-CM

## 2023-09-28 DIAGNOSIS — R1310 Dysphagia, unspecified: Secondary | ICD-10-CM

## 2023-09-28 DIAGNOSIS — M8580 Other specified disorders of bone density and structure, unspecified site: Secondary | ICD-10-CM | POA: Diagnosis present

## 2023-09-28 DIAGNOSIS — G40309 Generalized idiopathic epilepsy and epileptic syndromes, not intractable, without status epilepticus: Secondary | ICD-10-CM | POA: Diagnosis present

## 2023-09-28 DIAGNOSIS — Z880 Allergy status to penicillin: Secondary | ICD-10-CM

## 2023-09-28 DIAGNOSIS — R112 Nausea with vomiting, unspecified: Principal | ICD-10-CM | POA: Diagnosis present

## 2023-09-28 DIAGNOSIS — G40919 Epilepsy, unspecified, intractable, without status epilepticus: Secondary | ICD-10-CM | POA: Diagnosis present

## 2023-09-28 DIAGNOSIS — R6250 Unspecified lack of expected normal physiological development in childhood: Secondary | ICD-10-CM | POA: Diagnosis present

## 2023-09-28 DIAGNOSIS — Z1152 Encounter for screening for COVID-19: Secondary | ICD-10-CM

## 2023-09-28 DIAGNOSIS — K2100 Gastro-esophageal reflux disease with esophagitis, without bleeding: Secondary | ICD-10-CM | POA: Diagnosis present

## 2023-09-28 DIAGNOSIS — K089 Disorder of teeth and supporting structures, unspecified: Secondary | ICD-10-CM | POA: Diagnosis present

## 2023-09-28 DIAGNOSIS — Z151 Genetic susceptibility to epilepsy and neurodevelopmental disorders: Secondary | ICD-10-CM | POA: Diagnosis present

## 2023-09-28 DIAGNOSIS — D259 Leiomyoma of uterus, unspecified: Secondary | ICD-10-CM | POA: Diagnosis present

## 2023-09-28 DIAGNOSIS — E039 Hypothyroidism, unspecified: Secondary | ICD-10-CM | POA: Diagnosis present

## 2023-09-28 DIAGNOSIS — G40419 Other generalized epilepsy and epileptic syndromes, intractable, without status epilepticus: Secondary | ICD-10-CM | POA: Diagnosis present

## 2023-09-28 DIAGNOSIS — Z79899 Other long term (current) drug therapy: Secondary | ICD-10-CM

## 2023-09-28 DIAGNOSIS — Z79890 Hormone replacement therapy (postmenopausal): Secondary | ICD-10-CM

## 2023-09-28 DIAGNOSIS — Z807 Family history of other malignant neoplasms of lymphoid, hematopoietic and related tissues: Secondary | ICD-10-CM

## 2023-09-28 DIAGNOSIS — K222 Esophageal obstruction: Secondary | ICD-10-CM | POA: Diagnosis present

## 2023-09-28 LAB — CBC WITH DIFFERENTIAL/PLATELET
Abs Immature Granulocytes: 0.03 10*3/uL (ref 0.00–0.07)
Basophils Absolute: 0 10*3/uL (ref 0.0–0.1)
Basophils Relative: 0 %
Eosinophils Absolute: 0 10*3/uL (ref 0.0–0.5)
Eosinophils Relative: 0 %
HCT: 46.5 % — ABNORMAL HIGH (ref 36.0–46.0)
Hemoglobin: 15.5 g/dL — ABNORMAL HIGH (ref 12.0–15.0)
Immature Granulocytes: 0 %
Lymphocytes Relative: 11 %
Lymphs Abs: 1.3 10*3/uL (ref 0.7–4.0)
MCH: 33.8 pg (ref 26.0–34.0)
MCHC: 33.3 g/dL (ref 30.0–36.0)
MCV: 101.3 fL — ABNORMAL HIGH (ref 80.0–100.0)
Monocytes Absolute: 0.6 10*3/uL (ref 0.1–1.0)
Monocytes Relative: 5 %
Neutro Abs: 9.9 10*3/uL — ABNORMAL HIGH (ref 1.7–7.7)
Neutrophils Relative %: 84 %
Platelets: 465 10*3/uL — ABNORMAL HIGH (ref 150–400)
RBC: 4.59 MIL/uL (ref 3.87–5.11)
RDW: 12 % (ref 11.5–15.5)
WBC: 11.9 10*3/uL — ABNORMAL HIGH (ref 4.0–10.5)
nRBC: 0 % (ref 0.0–0.2)

## 2023-09-28 LAB — COMPREHENSIVE METABOLIC PANEL
ALT: 18 U/L (ref 0–44)
AST: 28 U/L (ref 15–41)
Albumin: 4.2 g/dL (ref 3.5–5.0)
Alkaline Phosphatase: 101 U/L (ref 38–126)
Anion gap: 15 (ref 5–15)
BUN: 14 mg/dL (ref 6–20)
CO2: 20 mmol/L — ABNORMAL LOW (ref 22–32)
Calcium: 9.5 mg/dL (ref 8.9–10.3)
Chloride: 110 mmol/L (ref 98–111)
Creatinine, Ser: 0.6 mg/dL (ref 0.44–1.00)
GFR, Estimated: >60 mL/min (ref >60)
Glucose, Bld: 112 mg/dL — ABNORMAL HIGH (ref 70–99)
Potassium: 3.5 mmol/L (ref 3.5–5.1)
Sodium: 145 mmol/L (ref 135–145)
Total Bilirubin: 0.9 mg/dL (ref 0.0–1.2)
Total Protein: 8.1 g/dL (ref 6.5–8.1)

## 2023-09-28 LAB — RESP PANEL BY RT-PCR (RSV, FLU A&B, COVID)  RVPGX2
Influenza A by PCR: POSITIVE — AB
Influenza B by PCR: NEGATIVE
Resp Syncytial Virus by PCR: NEGATIVE
SARS Coronavirus 2 by RT PCR: NEGATIVE

## 2023-09-28 MED ORDER — PHENYTOIN SODIUM 50 MG/ML IJ SOLN
50.0000 mg | Freq: Three times a day (TID) | INTRAMUSCULAR | Status: DC
  Administered 2023-09-28 – 2023-09-29 (×2): 50 mg via INTRAVENOUS
  Filled 2023-09-28 (×3): qty 1

## 2023-09-28 MED ORDER — DEXTROSE-SODIUM CHLORIDE 5-0.45 % IV SOLN: INTRAVENOUS | Status: AC

## 2023-09-28 MED ORDER — ACETAMINOPHEN 650 MG RE SUPP: 650.0000 mg | Freq: Four times a day (QID) | RECTAL | Status: DC | PRN

## 2023-09-28 MED ORDER — CARBAMAZEPINE 100 MG PO CHEW
200.0000 mg | CHEWABLE_TABLET | Freq: Four times a day (QID) | ORAL | Status: DC
Start: 2023-09-28 — End: 2023-09-28

## 2023-09-28 MED ORDER — CARBAMAZEPINE 100 MG PO CHEW
200.0000 mg | CHEWABLE_TABLET | Freq: Two times a day (BID) | ORAL | Status: DC
  Administered 2023-10-01 – 2023-10-02 (×3): 200 mg via ORAL
  Filled 2023-09-28 (×8): qty 2

## 2023-09-28 MED ORDER — SODIUM CHLORIDE 0.9 % IV SOLN
1250.0000 mg | Freq: Every evening | INTRAVENOUS | Status: DC
  Administered 2023-09-28 – 2023-09-30 (×3): 1250 mg via INTRAVENOUS
  Filled 2023-09-28 (×4): qty 12.5

## 2023-09-28 MED ORDER — CARBAMAZEPINE 100 MG PO CHEW
300.0000 mg | CHEWABLE_TABLET | Freq: Two times a day (BID) | ORAL | Status: DC
  Administered 2023-10-01 – 2023-10-02 (×2): 300 mg via ORAL
  Filled 2023-09-28 (×9): qty 3

## 2023-09-28 MED ORDER — ACETAMINOPHEN 325 MG PO TABS: 650.0000 mg | ORAL_TABLET | Freq: Four times a day (QID) | ORAL | Status: DC | PRN

## 2023-09-28 MED ORDER — ONDANSETRON 4 MG PO TBDP
4.0000 mg | ORAL_TABLET | Freq: Once | ORAL | Status: AC
  Administered 2023-09-28: 4 mg via ORAL
  Filled 2023-09-28: qty 1

## 2023-09-28 MED ORDER — ONDANSETRON HCL 4 MG PO TABS: 4.0000 mg | ORAL_TABLET | Freq: Four times a day (QID) | ORAL | Status: DC | PRN

## 2023-09-28 MED ORDER — IOHEXOL 300 MG/ML  SOLN
100.0000 mL | Freq: Once | INTRAMUSCULAR | Status: AC | PRN
  Administered 2023-09-28: 80 mL via INTRAVENOUS

## 2023-09-28 MED ORDER — LEVETIRACETAM IN NACL 1000 MG/100ML IV SOLN
1000.0000 mg | Freq: Every morning | INTRAVENOUS | Status: DC
  Administered 2023-09-29 – 2023-10-01 (×3): 1000 mg via INTRAVENOUS
  Filled 2023-09-28 (×3): qty 100

## 2023-09-28 MED ORDER — ONDANSETRON HCL 4 MG/2ML IJ SOLN
4.0000 mg | Freq: Four times a day (QID) | INTRAMUSCULAR | Status: DC | PRN
Start: 2023-09-28 — End: 2023-10-02

## 2023-09-28 NOTE — ED Triage Notes (Signed)
 Pt from group home for emesis, diagnosed with aspiration PNA yesterday and sent back because she cannot keep anything down

## 2023-09-28 NOTE — ED Provider Notes (Cosign Needed Addendum)
 Megan Walton EMERGENCY DEPARTMENT AT [facility] Provider Note   CSN: [id number] Arrival date & time: 09/28/23  1403     History  Chief Complaint  Patient presents with   Emesis   History obtained through patient's mother and caregiver at bedside.  Patient is nonverbal.  Megan Walton is a 53 y.o. female with developmental delay, hypothyroidism, Rett syndrome, presents with concern for not keeping anything down since yesterday.  Caregiver states that when patient is given any food or medication, this comes right back up.  She is also spitting out a clear mucus-like material.  Last bowel movement was yesterday.  No prior abdominal surgeries.  No fever.  Per family and caregiver at bedside, patient did not not seem to be in any pain.  She was seen in the ER yesterday for possible aspiration pneumonia.  She was started on Augmentin at that time, but has not been able to start the medication due to her vomiting episodes.   Emesis      Home Medications Prior to Admission medications   Medication Sig Start Date End Date Taking? Authorizing Provider  acyclovir (ZOVIRAX) 400 MG tablet Take 400 mg by mouth 2 (two) times daily.    [provider]  calcium carbonate (TUMS - DOSED IN MG ELEMENTAL CALCIUM) 500 MG chewable tablet Chew 1 tablet by mouth daily.    [provider]  carbamazepine (TEGRETOL) 100 MG chewable tablet Chew 200-300 mg by mouth 4 (four) times daily. Patient takes 300mg  at 7am and 8pm and 200mg  11am and 4pm    [provider]  cetaphil (CETAPHIL) cream Apply 1 application topically daily.    [provider]  Cholecalciferol (VITAMIN D PO) Take 2,000 Units by mouth daily.     [provider]  clindamycin (CLEOCIN) 150 MG capsule Take 1 capsule (150 mg total) by mouth every 6 (six) hours. 09/27/23   Lorre Nick, MD  Docosanol (ABREVA) 10 % CREA Apply topically daily.    [provider]  levETIRAcetam  (KEPPRA) 1000 MG tablet Take by mouth. Take 1000 mg in the morning, 1250 mg at bedtime.    [provider]  levOCARNitine (CARNITOR) 330 MG tablet Take 330 mg by mouth 3 (three) times daily. Take 2 tablets 3 x day.    [provider]  levothyroxine (SYNTHROID, LEVOTHROID) 50 MCG tablet Take 50 mcg by mouth once a week.     [provider]  phenytoin (DILANTIN) 50 MG tablet Chew by mouth 3 (three) times daily.    [provider]  polyethylene glycol (MIRALAX / GLYCOLAX) packet Take 17 g by mouth at bedtime. Mix 17 gram in juice or water at bedtime    [provider]  senna (SENOKOT) 8.6 MG TABS Take 1 tablet by mouth 2 (two) times daily.    [provider]      Allergies    Valproic acid, Biaxin [clarithromycin], Cephalexin, Clarithromycin, Divalproex sodium, Keflex [cephalexin], Macrolides and ketolides, and Penicillins    Review of Systems   Review of Systems  Gastrointestinal:  Positive for vomiting.    Physical Exam Updated Vital Signs BP (!) 125/95 (BP Location: Left Arm)   Pulse 95   Temp 98.4 F (36.9 C) (Axillary)   Resp 18   SpO2 100%  Physical Exam Vitals and nursing note reviewed.  Constitutional:      General: She is not in acute distress.    Appearance: She is well-developed.  Comments: Sitting in chair and spitting out clear liquid.   HENT:     Head: Normocephalic and atraumatic.  Eyes:     Conjunctiva/sclera: Conjunctivae normal.  Cardiovascular:     Rate and Rhythm: Normal rate and regular rhythm.     Heart sounds: No murmur heard. Pulmonary:     Effort: Pulmonary effort is normal. No respiratory distress.     Breath sounds: Normal breath sounds.  Abdominal:     Palpations: Abdomen is soft.     Tenderness: There is no abdominal tenderness.  Musculoskeletal:        General: No swelling.     Cervical back: Neck supple.  Skin:    General: Skin is warm and dry.     Capillary Refill: Capillary refill  takes less than 2 seconds.  Neurological:     Mental Status: She is alert.  Psychiatric:        Mood and Affect: Mood normal.     ED Results / Procedures / Treatments   Labs (all labs ordered are listed, but only abnormal results are displayed) Labs Reviewed  RESP PANEL BY RT-PCR (RSV, FLU A&B, COVID)  RVPGX2 - Abnormal; Notable for the following components:      Result Value   Influenza A by PCR POSITIVE (*)    All other components within normal limits  COMPREHENSIVE METABOLIC PANEL - Abnormal; Notable for the following components:   CO2 20 (*)    Glucose, Bld 112 (*)    All other components within normal limits  CBC WITH DIFFERENTIAL/PLATELET - Abnormal; Notable for the following components:   WBC 11.9 (*)    Hemoglobin 15.5 (*)    HCT 46.5 (*)    MCV 101.3 (*)    Platelets 465 (*)    Neutro Abs 9.9 (*)    All other components within normal limits    EKG None  Radiology CT ABDOMEN PELVIS W CONTRAST Result Date: 09/28/2023 CLINICAL DATA:  Acute abdominal pain and vomiting beginning yesterday. EXAM: CT ABDOMEN AND PELVIS WITH CONTRAST TECHNIQUE: Multidetector CT imaging of the abdomen and pelvis was performed using the standard protocol following bolus administration of intravenous contrast. RADIATION DOSE REDUCTION: This exam was performed according to the departmental dose-optimization program which includes automated exposure control, adjustment of the mA and/or kV according to patient size and/or use of iterative reconstruction technique. CONTRAST:  80mL OMNIPAQUE IOHEXOL 300 MG/ML  SOLN COMPARISON:  None Available. FINDINGS: Lower Chest: Dilated thoracic esophagus is seen containing fluid and food stuff, with transition point at the GE junction. This could be due to esophageal stricture or achalasia. Lung bases are clear. Hepatobiliary: No suspicious hepatic masses identified. Gallbladder is unremarkable. No evidence of biliary ductal dilatation. Pancreas:  No mass or  inflammatory changes. Spleen: Within normal limits in size and appearance. Adrenals/Urinary Tract: No suspicious masses identified. No evidence of ureteral calculi or hydronephrosis. Unremarkable unopacified urinary bladder. Stomach/Bowel: No evidence of obstruction, inflammatory process or abnormal fluid collections. Normal appendix visualized. Vascular/Lymphatic: No pathologically enlarged lymph nodes. No acute vascular findings. Reproductive: 1 cm fibroid seen in the anterior uterine corpus. Heterogeneous low-attenuation lesion is seen in the left adnexa which measures 4 x 3 cm and has a somewhat serpiginous appearance best seen on coronal images. Differential diagnosis includes hydro-pyo-salpinx, endometrioma, and ovarian neoplasm. No evidence of peritoneal thickening or ascites. Other:  None. Musculoskeletal:  No suspicious bone lesions identified. IMPRESSION: Dilated thoracic esophagus containing fluid and food stuff, with transition point at the  GE junction. This could be due to esophageal stricture or achalasia. 4 cm heterogeneous low-attenuation lesion in left adnexa. Differential diagnosis includes hydro-pyo-salpinx, endometrioma, and ovarian neoplasm. Recommend pelvic ultrasound for further characterization. Small uterine fibroid. Electronically Signed   By: Danae Orleans M.D.   On: 09/28/2023 18:11   DG Chest Portable 1 View Result Date: 09/28/2023 CLINICAL DATA:  Concern for aspiration pneumonia. EXAM: PORTABLE CHEST 1 VIEW COMPARISON:  Chest radiograph dated 09/27/2023 FINDINGS: No focal consolidation, pleural effusion, pneumothorax. The cardiac silhouette is within normal limits. No acute osseous pathology. IMPRESSION: No active disease. Electronically Signed   By: Elgie Collard M.D.   On: 09/28/2023 16:46   DG Chest 2 View Result Date: 09/27/2023 CLINICAL DATA:  Cough and vomiting. EXAM: CHEST - 2 VIEW COMPARISON:  Chest radiograph 10/08/2022 FINDINGS: The cardiomediastinal contours are  normal. The lungs are clear. Pulmonary vasculature is normal. No consolidation, pleural effusion, or pneumothorax. No acute osseous abnormalities are seen. IMPRESSION: No active cardiopulmonary disease. Electronically Signed   By: Narda Rutherford M.D.   On: 09/27/2023 21:07    Procedures Procedures    Medications Ordered in ED Medications  dextrose 5 % and 0.45 % NaCl infusion (has no administration in time range)  levETIRAcetam (KEPPRA) IVPB 1000 mg/100 mL premix (has no administration in time range)  levETIRAcetam (KEPPRA) 1,250 mg in sodium chloride 0.9 % 100 mL IVPB (has no administration in time range)  phenytoin (DILANTIN) injection 50 mg (has no administration in time range)  ondansetron (ZOFRAN-ODT) disintegrating tablet 4 mg (4 mg Oral Given 09/28/23 1547)  iohexol (OMNIPAQUE) 300 MG/ML solution 100 mL (80 mLs Intravenous Contrast Given 09/28/23 1713)    ED Course/ Medical Decision Making/ A&P                                 Medical Decision Making Amount and/or Complexity of Data Reviewed Labs: ordered. Radiology: ordered.  Risk Prescription drug management. Decision regarding hospitalization.     Differential diagnosis includes but is not limited to COVID, flu, strep, pneumonia, small bowel obstruction  ED Course:  Patient well-appearing, stable vital signs aside from a mildly elevated blood pressure 125/95 upon arrival.  She is nonverbal, but does not appear to be in any pain on my exam, or per caregivers at bedside.  She was spitting up upon my exam, and per caregivers, have not been able to keep anything down at her facility.  I Ordered, and personally interpreted labs.  The pertinent results include:   CBC with leukocytosis of 11.9, hemoglobin 15.5 CMP unremarkable Influenza A positive.  COVID, RSV negative Chest x-ray unremarkable.  CT abdomen pelvis revealed dilated thoracic esophagus containing fluid and food, with transition point at the GE junction.   Concerning for possible esophageal stricture or achalasia.  A low-attenuation lesion was noted in the left adnexa, pelvic ultrasound was recommended for further characterization. Patient was given Zofran upon arrival, but this did not seem to improve her symptoms upon reevaluation.  I discussed the CT findings with GI Dr. Loreta Ave, they recommended medicine admission with plan on seeing the patient in consult tomorrow.  The plan to admit patient to the hospitalist service with gastroenterology consult was discussed with patient's mother and legal guardian who is at bedside.  She is in agreement with this plan of care.  Impression: Vomiting likely due to achalasia or esophageal stricture Influenza A  Disposition:  Admission with Dr.  Ghimire  Imaging Studies ordered: I ordered imaging studies including chest x-ray, CT abdomen pelvis I independently visualized the imaging with scope of interpretation limited to determining acute life threatening conditions related to emergency care. Imaging showed  Chest x-ray without acute abnormality CT abdomen pelvis revealed dilated thoracic esophagus containing fluid and food, with transition point at the GE junction.  Concerning for possible esophageal stricture or achalasia.  A low-attenuation lesion was noted in the left adnexa, pelvic ultrasound was recommended for further characterization. I agree with the radiologist interpretation    Consultations Obtained: I requested consultation with the gastroenterologist Dr. Loreta Ave,  and discussed lab and imaging findings as well as pertinent plan - they recommend: Admission to medicine, they will plan on consulting I requested consultation with the hospitalist Dr. Jerral Ralph,  and discussed lab and imaging findings as well as pertinent plan - they recommend: admission                Final Clinical Impression(s) / ED Diagnoses Final diagnoses:  Nausea and vomiting, unspecified vomiting type  Influenza A     Rx / DC Orders ED Discharge Orders     None         Arabella Merles, PA-C 09/28/23 1935    Arabella Merles, PA-C 09/28/23 1935    Charlynne Pander, MD 09/29/23 [phone]

## 2023-09-28 NOTE — H&P (Signed)
 History and Physical    Megan Walton:[id number] DOB: [birth year] DOA: 09/28/2023  PCP: Lucretia Field  Patient coming from: Group home  I have personally briefly reviewed patient's old medical records available.   Chief Complaint: Vomiting, had choking episode yesterday and now just vomiting saliva and mucus.  HPI: Megan Walton is a 53 y.o. 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  Today, she is not able to eat anything.  Everything she was given she is vomiting out.  At other times she is vomiting mucus-like materials.  She had normal bowel movements yesterday.  The patient is unable to give any history.  She looks comfortable. Mother at the bedside cannot give detailed history.  At the present time, mother noted patient looks fairly comfortable.  She is on room air. ED Course: Hemodynamically stable.  On room air.  Electrolytes are adequate.  Pleasant but not able to interact. Chest x-ray normal. CT scan abdomen pelvis consistent with dilated thoracic part of esophagus with possible food debris or stricture at the GE junction. Possible food impaction with possible chronic dysphagia or achalasia. GI was consulted.  Recommended to keep n.p.o. and will see tomorrow. Patient on multiple seizure medications that are essential, will try to find IV seizure medications.  Review of Systems: all systems are reviewed and pertinent positive as per HPI otherwise rest are negative.    Past Medical History:  Diagnosis Date   Constipation, chronic    Developmental delay    Foot fracture    Herpes simplex type 1 antibody positive    Hypothyroidism    Mental retardation    Mental  retardation    MR (mental retardation)    profound   Osteopenia    Right Hip   Rett syndrome    Rett's syndrome    Rett's syndrome    Seizures (HCC)    Thyroid disease     History reviewed. No pertinent surgical history.  Social history   reports that she has never smoked. She has never used smokeless tobacco. She reports that she does not drink alcohol and does not use drugs.  Allergies  Allergen Reactions   Valproic Acid Other (See Comments)   Biaxin [Clarithromycin]    Cephalexin    Clarithromycin    Divalproex Sodium    Keflex [Cephalexin]    Macrolides And Ketolides    Penicillins     Family History  Problem Relation Age of Onset   Lymphoma Father    Healthy Mother    Healthy Sister    ADD / ADHD Brother    Healthy Brother      Prior to Admission medications   Medication Sig Start Date End Date Taking? Authorizing Provider  acyclovir (ZOVIRAX) 400 MG tablet Take 400 mg by mouth 2 (two) times daily.    [provider]  calcium carbonate (TUMS - DOSED IN MG ELEMENTAL CALCIUM) 500 MG chewable tablet Chew 1 tablet by mouth daily.    [provider]  carbamazepine (TEGRETOL) 100 MG chewable tablet Chew 200-300 mg by mouth 4 (four) times daily. Patient takes 300mg  at 7am and 8pm and 200mg  11am and 4pm    [provider]  cetaphil (CETAPHIL)  cream Apply 1 application topically daily.    [provider]  Cholecalciferol (VITAMIN D PO) Take 2,000 Units by mouth daily.     [provider]  clindamycin (CLEOCIN) 150 MG capsule Take 1 capsule (150 mg total) by mouth every 6 (six) hours. 09/27/23   Lorre Nick, MD  Docosanol (ABREVA) 10 % CREA Apply topically daily.    [provider]  levETIRAcetam (KEPPRA) 1000 MG tablet Take by mouth. Take 1000 mg in the morning, 1250 mg at bedtime.    [provider]  levOCARNitine (CARNITOR) 330 MG tablet Take 330 mg by mouth 3 (three) times daily. Take 2 tablets 3 x day.     [provider]  levothyroxine (SYNTHROID, LEVOTHROID) 50 MCG tablet Take 50 mcg by mouth once a week.     [provider]  phenytoin (DILANTIN) 50 MG tablet Chew by mouth 3 (three) times daily.    [provider]  polyethylene glycol (MIRALAX / GLYCOLAX) packet Take 17 g by mouth at bedtime. Mix 17 gram in juice or water at bedtime    [provider]  senna (SENOKOT) 8.6 MG TABS Take 1 tablet by mouth 2 (two) times daily.    [provider]    Physical Exam: Vitals:   09/28/23 1413  BP: (!) 125/95  Pulse: 95  Resp: 18  Temp: 98.4 F (36.9 C)  TempSrc: Axillary  SpO2: 100%    Constitutional: NAD, calm, comfortable.  Unable to interact. Vitals:   09/28/23 1413  BP: (!) 125/95  Pulse: 95  Resp: 18  Temp: 98.4 F (36.9 C)  TempSrc: Axillary  SpO2: 100%   Eyes: PERRL, lids and conjunctivae normal ENMT: Mucous membranes are moist. Posterior pharynx clear of any exudate or lesions.Normal dentition.  Neck: normal, supple, no masses, no thyromegaly Respiratory: No added sounds. Cardiovascular: Regular rate and rhythm, no murmurs / rubs / gallops. No extremity edema. 2+ pedal pulses. No carotid bruits.  Abdomen: no tenderness, no masses palpated. No hepatosplenomegaly. Bowel sounds positive.  Musculoskeletal: no clubbing / cyanosis. No joint deformity upper and lower extremities. Good ROM, no contractures.  Hypertonicity of the extremities.  She has baseline shakiness. Skin: no rashes, lesions, ulcers. No induration Neurologic: CN 2-12 grossly intact.  Hypertonicity.  Does not follow commands. Psychiatric: Difficult to assess.  Looks comfortable.  Evidenced by mother at the bedside.    Labs on Admission: I have personally reviewed following labs and imaging studies  CBC: Recent Labs  Lab 09/28/23 1610  WBC 11.9*  NEUTROABS 9.9*  HGB 15.5*  HCT 46.5*  MCV 101.3*  PLT 465*   Basic Metabolic Panel: Recent Labs  Lab  09/28/23 1610  NA 145  K 3.5  CL 110  CO2 20*  GLUCOSE 112*  BUN 14  CREATININE 0.60  CALCIUM 9.5   GFR: CrCl cannot be calculated (Unknown ideal weight.). Liver Function Tests: Recent Labs  Lab 09/28/23 1610  AST 28  ALT 18  ALKPHOS 101  BILITOT 0.9  PROT 8.1  ALBUMIN 4.2   No results for input(s): "LIPASE", "AMYLASE" in the last 168 hours. No results for input(s): "AMMONIA" in the last 168 hours. Coagulation Profile: No results for input(s): "INR", "PROTIME" in the last 168 hours. Cardiac Enzymes: No results for input(s): "CKTOTAL", "CKMB", "CKMBINDEX", "TROPONINI" in the last 168 hours. BNP (last 3 results) No results for input(s): "PROBNP" in the last 8760 hours. HbA1C: No results for input(s): "HGBA1C" in the last 72 hours. CBG:  No results for input(s): "GLUCAP" in the last 168 hours. Lipid Profile: No results for input(s): "CHOL", "HDL", "LDLCALC", "TRIG", "CHOLHDL", "LDLDIRECT" in the last 72 hours. Thyroid Function Tests: No results for input(s): "TSH", "T4TOTAL", "FREET4", "T3FREE", "THYROIDAB" in the last 72 hours. Anemia Panel: No results for input(s): "VITAMINB12", "FOLATE", "FERRITIN", "TIBC", "IRON", "RETICCTPCT" in the last 72 hours. Urine analysis:    Component Value Date/Time   COLORURINE AMBER (A) 10/08/2011 1330   APPEARANCEUR CLEAR 10/08/2011 1330   LABSPEC 1.042 (H) 10/08/2011 1330   PHURINE 5.5 10/08/2011 1330   GLUCOSEU NEGATIVE 10/08/2011 1330   HGBUR NEGATIVE 10/08/2011 1330   BILIRUBINUR SMALL (A) 10/08/2011 1330   KETONESUR 15 (A) 10/08/2011 1330   PROTEINUR 30 (A) 10/08/2011 1330   UROBILINOGEN 0.2 10/08/2011 1330   NITRITE NEGATIVE 10/08/2011 1330   LEUKOCYTESUR SMALL (A) 10/08/2011 1330    Radiological Exams on Admission: CT ABDOMEN PELVIS W CONTRAST Result Date: 09/28/2023 CLINICAL DATA:  Acute abdominal pain and vomiting beginning yesterday. EXAM: CT ABDOMEN AND PELVIS WITH CONTRAST TECHNIQUE: Multidetector CT imaging of the  abdomen and pelvis was performed using the standard protocol following bolus administration of intravenous contrast. RADIATION DOSE REDUCTION: This exam was performed according to the departmental dose-optimization program which includes automated exposure control, adjustment of the mA and/or kV according to patient size and/or use of iterative reconstruction technique. CONTRAST:  80mL OMNIPAQUE IOHEXOL 300 MG/ML  SOLN COMPARISON:  None Available. FINDINGS: Lower Chest: Dilated thoracic esophagus is seen containing fluid and food stuff, with transition point at the GE junction. This could be due to esophageal stricture or achalasia. Lung bases are clear. Hepatobiliary: No suspicious hepatic masses identified. Gallbladder is unremarkable. No evidence of biliary ductal dilatation. Pancreas:  No mass or inflammatory changes. Spleen: Within normal limits in size and appearance. Adrenals/Urinary Tract: No suspicious masses identified. No evidence of ureteral calculi or hydronephrosis. Unremarkable unopacified urinary bladder. Stomach/Bowel: No evidence of obstruction, inflammatory process or abnormal fluid collections. Normal appendix visualized. Vascular/Lymphatic: No pathologically enlarged lymph nodes. No acute vascular findings. Reproductive: 1 cm fibroid seen in the anterior uterine corpus. Heterogeneous low-attenuation lesion is seen in the left adnexa which measures 4 x 3 cm and has a somewhat serpiginous appearance best seen on coronal images. Differential diagnosis includes hydro-pyo-salpinx, endometrioma, and ovarian neoplasm. No evidence of peritoneal thickening or ascites. Other:  None. Musculoskeletal:  No suspicious bone lesions identified. IMPRESSION: Dilated thoracic esophagus containing fluid and food stuff, with transition point at the GE junction. This could be due to esophageal stricture or achalasia. 4 cm heterogeneous low-attenuation lesion in left adnexa. Differential diagnosis includes  hydro-pyo-salpinx, endometrioma, and ovarian neoplasm. Recommend pelvic ultrasound for further characterization. Small uterine fibroid. Electronically Signed   By: Danae Orleans M.D.   On: 09/28/2023 18:11   DG Chest Portable 1 View Result Date: 09/28/2023 CLINICAL DATA:  Concern for aspiration pneumonia. EXAM: PORTABLE CHEST 1 VIEW COMPARISON:  Chest radiograph dated 09/27/2023 FINDINGS: No focal consolidation, pleural effusion, pneumothorax. The cardiac silhouette is within normal limits. No acute osseous pathology. IMPRESSION: No active disease. Electronically Signed   By: Elgie Collard M.D.   On: 09/28/2023 16:46   DG Chest 2 View Result Date: 09/27/2023 CLINICAL DATA:  Cough and vomiting. EXAM: CHEST - 2 VIEW COMPARISON:  Chest radiograph 10/08/2022 FINDINGS: The cardiomediastinal contours are normal. The lungs are clear. Pulmonary vasculature is normal. No consolidation, pleural effusion, or pneumothorax. No acute osseous abnormalities are seen. IMPRESSION: No active cardiopulmonary disease. Electronically Signed  By: Narda Rutherford M.D.   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  Admit.  N.p.o. with chewable medications only. Maintenance IV fluids. GI to see.  Dr. Loreta Ave was consulted by ER.  Patient will need upper GI endoscopy.  Likely tomorrow morning.  2.  Generalized tonic-clonic seizure: Seizure-free for last 3 years.  Patient is with esophageal obstruction and unable to take oral medications. Keppra-changing to IV Phenytoin-changing to IV Tegretol-chewable tablets. Seizure precautions.  3.  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   DVT prophylaxis: SCD Code Status: Full  code Family Communication: Mother at the bedside Disposition Plan: Back to go home and stable Consults called: Gastroenterology, Dr. Loreta Ave Admission status: Observation telemetry   Dorcas Carrow MD [facility] Pager [phone]

## 2023-09-28 NOTE — ED Notes (Signed)
Pt cleaned and changed into gown.

## 2023-09-29 DIAGNOSIS — F842 Rett's syndrome: Secondary | ICD-10-CM | POA: Diagnosis present

## 2023-09-29 DIAGNOSIS — J101 Influenza due to other identified influenza virus with other respiratory manifestations: Secondary | ICD-10-CM | POA: Diagnosis present

## 2023-09-29 DIAGNOSIS — W44F3XA Food entering into or through a natural orifice, initial encounter: Secondary | ICD-10-CM | POA: Diagnosis present

## 2023-09-29 DIAGNOSIS — Z881 Allergy status to other antibiotic agents status: Secondary | ICD-10-CM | POA: Diagnosis not present

## 2023-09-29 DIAGNOSIS — K222 Esophageal obstruction: Secondary | ICD-10-CM

## 2023-09-29 DIAGNOSIS — T18128A Food in esophagus causing other injury, initial encounter: Secondary | ICD-10-CM | POA: Diagnosis present

## 2023-09-29 DIAGNOSIS — D259 Leiomyoma of uterus, unspecified: Secondary | ICD-10-CM | POA: Diagnosis present

## 2023-09-29 DIAGNOSIS — E039 Hypothyroidism, unspecified: Secondary | ICD-10-CM | POA: Diagnosis present

## 2023-09-29 DIAGNOSIS — Z1152 Encounter for screening for COVID-19: Secondary | ICD-10-CM | POA: Diagnosis not present

## 2023-09-29 DIAGNOSIS — G40919 Epilepsy, unspecified, intractable, without status epilepticus: Secondary | ICD-10-CM | POA: Diagnosis not present

## 2023-09-29 DIAGNOSIS — R059 Cough, unspecified: Secondary | ICD-10-CM | POA: Diagnosis present

## 2023-09-29 DIAGNOSIS — Z880 Allergy status to penicillin: Secondary | ICD-10-CM | POA: Diagnosis not present

## 2023-09-29 DIAGNOSIS — R1319 Other dysphagia: Secondary | ICD-10-CM | POA: Diagnosis not present

## 2023-09-29 DIAGNOSIS — K089 Disorder of teeth and supporting structures, unspecified: Secondary | ICD-10-CM | POA: Diagnosis present

## 2023-09-29 DIAGNOSIS — R6250 Unspecified lack of expected normal physiological development in childhood: Secondary | ICD-10-CM | POA: Diagnosis present

## 2023-09-29 DIAGNOSIS — G40309 Generalized idiopathic epilepsy and epileptic syndromes, not intractable, without status epilepticus: Secondary | ICD-10-CM

## 2023-09-29 DIAGNOSIS — Z807 Family history of other malignant neoplasms of lymphoid, hematopoietic and related tissues: Secondary | ICD-10-CM | POA: Diagnosis not present

## 2023-09-29 DIAGNOSIS — Z818 Family history of other mental and behavioral disorders: Secondary | ICD-10-CM | POA: Diagnosis not present

## 2023-09-29 DIAGNOSIS — Z79890 Hormone replacement therapy (postmenopausal): Secondary | ICD-10-CM | POA: Diagnosis not present

## 2023-09-29 DIAGNOSIS — F79 Unspecified intellectual disabilities: Secondary | ICD-10-CM | POA: Diagnosis present

## 2023-09-29 DIAGNOSIS — R112 Nausea with vomiting, unspecified: Secondary | ICD-10-CM | POA: Diagnosis present

## 2023-09-29 DIAGNOSIS — K2100 Gastro-esophageal reflux disease with esophagitis, without bleeding: Secondary | ICD-10-CM | POA: Diagnosis present

## 2023-09-29 DIAGNOSIS — K5909 Other constipation: Secondary | ICD-10-CM | POA: Diagnosis present

## 2023-09-29 DIAGNOSIS — Z151 Genetic susceptibility to epilepsy and neurodevelopmental disorders: Secondary | ICD-10-CM

## 2023-09-29 DIAGNOSIS — G40419 Other generalized epilepsy and epileptic syndromes, intractable, without status epilepticus: Secondary | ICD-10-CM | POA: Diagnosis present

## 2023-09-29 DIAGNOSIS — M8580 Other specified disorders of bone density and structure, unspecified site: Secondary | ICD-10-CM | POA: Diagnosis present

## 2023-09-29 DIAGNOSIS — Z79899 Other long term (current) drug therapy: Secondary | ICD-10-CM | POA: Diagnosis not present

## 2023-09-29 LAB — CBC WITH DIFFERENTIAL/PLATELET
Abs Immature Granulocytes: 0.05 10*3/uL (ref 0.00–0.07)
Basophils Absolute: 0 10*3/uL (ref 0.0–0.1)
Basophils Relative: 0 %
Eosinophils Absolute: 0 10*3/uL (ref 0.0–0.5)
Eosinophils Relative: 0 %
HCT: 47 % — ABNORMAL HIGH (ref 36.0–46.0)
Hemoglobin: 15.1 g/dL — ABNORMAL HIGH (ref 12.0–15.0)
Immature Granulocytes: 0 %
Lymphocytes Relative: 15 %
Lymphs Abs: 2.2 10*3/uL (ref 0.7–4.0)
MCH: 33.4 pg (ref 26.0–34.0)
MCHC: 32.1 g/dL (ref 30.0–36.0)
MCV: 104 fL — ABNORMAL HIGH (ref 80.0–100.0)
Monocytes Absolute: 1.3 10*3/uL — ABNORMAL HIGH (ref 0.1–1.0)
Monocytes Relative: 9 %
Neutro Abs: 10.8 10*3/uL — ABNORMAL HIGH (ref 1.7–7.7)
Neutrophils Relative %: 76 %
Platelets: 321 10*3/uL (ref 150–400)
RBC: 4.52 MIL/uL (ref 3.87–5.11)
RDW: 12.2 % (ref 11.5–15.5)
WBC: 14.4 10*3/uL — ABNORMAL HIGH (ref 4.0–10.5)
nRBC: 0 % (ref 0.0–0.2)

## 2023-09-29 LAB — COMPREHENSIVE METABOLIC PANEL
ALT: 17 U/L (ref 0–44)
AST: 24 U/L (ref 15–41)
Albumin: 3.7 g/dL (ref 3.5–5.0)
Alkaline Phosphatase: 93 U/L (ref 38–126)
Anion gap: 13 (ref 5–15)
BUN: 11 mg/dL (ref 6–20)
CO2: 19 mmol/L — ABNORMAL LOW (ref 22–32)
Calcium: 8.7 mg/dL — ABNORMAL LOW (ref 8.9–10.3)
Chloride: 111 mmol/L (ref 98–111)
Creatinine, Ser: 0.49 mg/dL (ref 0.44–1.00)
GFR, Estimated: >60 mL/min (ref >60)
Glucose, Bld: 95 mg/dL (ref 70–99)
Potassium: 3.3 mmol/L — ABNORMAL LOW (ref 3.5–5.1)
Sodium: 143 mmol/L (ref 135–145)
Total Bilirubin: 0.8 mg/dL (ref 0.0–1.2)
Total Protein: 7.1 g/dL (ref 6.5–8.1)

## 2023-09-29 LAB — HIV ANTIBODY (ROUTINE TESTING W REFLEX): HIV Screen 4th Generation wRfx: NONREACTIVE

## 2023-09-29 MED ORDER — CARMEX CLASSIC LIP BALM EX OINT
TOPICAL_OINTMENT | CUTANEOUS | Status: DC | PRN
  Filled 2023-09-29: qty 10

## 2023-09-29 MED ORDER — LACTATED RINGERS IV SOLN: INTRAVENOUS | Status: AC

## 2023-09-29 MED ORDER — PHENYTOIN SODIUM 50 MG/ML IJ SOLN
100.0000 mg | Freq: Every day | INTRAMUSCULAR | Status: DC
  Administered 2023-09-30 – 2023-10-01 (×2): 100 mg via INTRAVENOUS
  Filled 2023-09-29 (×2): qty 2

## 2023-09-29 MED ORDER — PHENYTOIN SODIUM 50 MG/ML IJ SOLN
50.0000 mg | Freq: Two times a day (BID) | INTRAMUSCULAR | Status: AC
  Administered 2023-09-29: 50 mg via INTRAVENOUS
  Filled 2023-09-29: qty 1

## 2023-09-29 NOTE — Consult Note (Signed)
 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  She acutely had nausea and vomiting this past Tuesday, but she was sent home.  Unfortunately she was not able to tolerate any PO and she represented in Wednesday evening with a subsequent admission.  The CT scan of the abdomen showed a dilated thoracic esophagus with a narrowing at the GE junction.  It was not known if this was a stricture versus achalasia.  The patient does not have any prior issues with dysphagia.  Per her sister, this is a completely new issue, but her sister recalls a Speech Path evaluation 6 months ago.  However, she was cleared by Speech Path and she was able to eat.  Past Medical History:  Diagnosis Date   Constipation, chronic    Developmental delay    Foot fracture    Herpes simplex type 1 antibody positive    Hypothyroidism    Mental retardation    Mental retardation    MR (mental retardation)    profound   Osteopenia    Right Hip   Rett syndrome    Rett's syndrome    Rett's syndrome    Seizures (HCC)    Thyroid disease     History reviewed. No pertinent surgical history.  Family History  Problem Relation Age of Onset   Lymphoma Father    Healthy Mother    Healthy Sister    ADD / ADHD Brother    Healthy Brother     Social History:  reports that she has never smoked. She has never used smokeless tobacco. She reports that she does not drink alcohol and does not use drugs.  Allergies:  Allergies  Allergen Reactions   Valproic Acid Other (See Comments)    Unknown. Reaction not listed on MAR    Cephalexin Other (See Comments)    Unknown. Reaction not listed on MAR    Clarithromycin Other (See Comments)    Unknown. Reaction not listed on MAR    Macrolides And Ketolides Other (See Comments)    Unknown. Reaction not listed on MAR    Penicillins  Other (See Comments)    Unknown. Reaction not listed on MAR     Medications: Scheduled:  carbamazepine  200 mg Oral BID   carbamazepine  300 mg Oral BID   [START ON 09/30/2023] phenytoin (DILANTIN) IV  100 mg Intravenous Daily   phenytoin (DILANTIN) IV  50 mg Intravenous Q12H   Continuous:  dextrose 5 % and 0.45 % NaCl Stopped (09/29/23 1647)   lactated ringers 100 mL/hr at 09/29/23 1647   levETIRAcetam Stopped (09/28/23 2244)   levETIRAcetam Stopped (09/29/23 1009)    Results for orders placed or performed during the hospital encounter of 09/28/23 (from the past 24 hours)  HIV Antibody (routine testing w rflx)     Status: None   Collection Time: 09/29/23  9:01 AM  Result Value Ref Range   HIV Screen 4th Generation wRfx Non Reactive Non Reactive  CBC with Differential/Platelet     Status: Abnormal   Collection Time: 09/29/23  9:01 AM  Result Value Ref Range   WBC 14.4 (H) 4.0 - 10.5 K/uL   RBC 4.52 3.87 - 5.11 MIL/uL   Hemoglobin 15.1 (H) 12.0 - 15.0 g/dL   HCT 01.6 (H) 01.0 - 93.2 %   MCV 104.0 (H) 80.0 - 100.0 fL  MCH 33.4 26.0 - 34.0 pg   MCHC 32.1 30.0 - 36.0 g/dL   RDW 41.3 24.4 - 01.0 %   Platelets 321 150 - 400 K/uL   nRBC 0.0 0.0 - 0.2 %   Neutrophils Relative % 76 %   Neutro Abs 10.8 (H) 1.7 - 7.7 K/uL   Lymphocytes Relative 15 %   Lymphs Abs 2.2 0.7 - 4.0 K/uL   Monocytes Relative 9 %   Monocytes Absolute 1.3 (H) 0.1 - 1.0 K/uL   Eosinophils Relative 0 %   Eosinophils Absolute 0.0 0.0 - 0.5 K/uL   Basophils Relative 0 %   Basophils Absolute 0.0 0.0 - 0.1 K/uL   Immature Granulocytes 0 %   Abs Immature Granulocytes 0.05 0.00 - 0.07 K/uL  Comprehensive metabolic panel     Status: Abnormal   Collection Time: 09/29/23  9:01 AM  Result Value Ref Range   Sodium 143 135 - 145 mmol/L   Potassium 3.3 (L) 3.5 - 5.1 mmol/L   Chloride 111 98 - 111 mmol/L   CO2 19 (L) 22 - 32 mmol/L   Glucose, Bld 95 70 - 99 mg/dL   BUN 11 6 - 20 mg/dL   Creatinine, Ser 2.72 0.44  - 1.00 mg/dL   Calcium 8.7 (L) 8.9 - 10.3 mg/dL   Total Protein 7.1 6.5 - 8.1 g/dL   Albumin 3.7 3.5 - 5.0 g/dL   AST 24 15 - 41 U/L   ALT 17 0 - 44 U/L   Alkaline Phosphatase 93 38 - 126 U/L   Total Bilirubin 0.8 0.0 - 1.2 mg/dL   GFR, Estimated >53 >66 mL/min   Anion gap 13 5 - 15     CT ABDOMEN PELVIS W CONTRAST Result Date: 09/28/2023 CLINICAL DATA:  Acute abdominal pain and vomiting beginning yesterday. EXAM: CT ABDOMEN AND PELVIS WITH CONTRAST TECHNIQUE: Multidetector CT imaging of the abdomen and pelvis was performed using the standard protocol following bolus administration of intravenous contrast. RADIATION DOSE REDUCTION: This exam was performed according to the departmental dose-optimization program which includes automated exposure control, adjustment of the mA and/or kV according to patient size and/or use of iterative reconstruction technique. CONTRAST:  80mL OMNIPAQUE IOHEXOL 300 MG/ML  SOLN COMPARISON:  None Available. FINDINGS: Lower Chest: Dilated thoracic esophagus is seen containing fluid and food stuff, with transition point at the GE junction. This could be due to esophageal stricture or achalasia. Lung bases are clear. Hepatobiliary: No suspicious hepatic masses identified. Gallbladder is unremarkable. No evidence of biliary ductal dilatation. Pancreas:  No mass or inflammatory changes. Spleen: Within normal limits in size and appearance. Adrenals/Urinary Tract: No suspicious masses identified. No evidence of ureteral calculi or hydronephrosis. Unremarkable unopacified urinary bladder. Stomach/Bowel: No evidence of obstruction, inflammatory process or abnormal fluid collections. Normal appendix visualized. Vascular/Lymphatic: No pathologically enlarged lymph nodes. No acute vascular findings. Reproductive: 1 cm fibroid seen in the anterior uterine corpus. Heterogeneous low-attenuation lesion is seen in the left adnexa which measures 4 x 3 cm and has a somewhat serpiginous  appearance best seen on coronal images. Differential diagnosis includes hydro-pyo-salpinx, endometrioma, and ovarian neoplasm. No evidence of peritoneal thickening or ascites. Other:  None. Musculoskeletal:  No suspicious bone lesions identified. IMPRESSION: Dilated thoracic esophagus containing fluid and food stuff, with transition point at the GE junction. This could be due to esophageal stricture or achalasia. 4 cm heterogeneous low-attenuation lesion in left adnexa. Differential diagnosis includes hydro-pyo-salpinx, endometrioma, and ovarian neoplasm. Recommend pelvic ultrasound for further  characterization. Small uterine fibroid. Electronically Signed   By: Danae Orleans M.D.   On: 09/28/2023 18:11   DG Chest Portable 1 View Result Date: 09/28/2023 CLINICAL DATA:  Concern for aspiration pneumonia. EXAM: PORTABLE CHEST 1 VIEW COMPARISON:  Chest radiograph dated 09/27/2023 FINDINGS: No focal consolidation, pleural effusion, pneumothorax. The cardiac silhouette is within normal limits. No acute osseous pathology. IMPRESSION: No active disease. Electronically Signed   By: Elgie Collard M.D.   On: 09/28/2023 16:46   DG Chest 2 View Result Date: 09/27/2023 CLINICAL DATA:  Cough and vomiting. EXAM: CHEST - 2 VIEW COMPARISON:  Chest radiograph 10/08/2022 FINDINGS: The cardiomediastinal contours are normal. The lungs are clear. Pulmonary vasculature is normal. No consolidation, pleural effusion, or pneumothorax. No acute osseous abnormalities are seen. IMPRESSION: No active cardiopulmonary disease. Electronically Signed   By: Narda Rutherford M.D.   On: 09/27/2023 21:07    ROS:  As stated above in the HPI otherwise negative.  Blood pressure 120/86, pulse 82, temperature 98.6 F (37 C), temperature source Oral, resp. rate 18, height 4\' 11"  (1.499 m), weight 46.4 kg, SpO2 100%.    PE: Gen: NAD, Alert HEENT:  /AT, EOMI Neck: Supple, no LAD Lungs: CTA Bilaterally CV: RRR without M/G/R ABD: Soft,  NTND, +BS Ext: No C/C/E  Assessment/Plan: 1) Distal esophageal stricture of unknown etiology. 2) Nausea/vomiting. 3) Rett's syndrome.   She is stable.  There is no issue with aspiration, however, she was noted to have influenza.  An EGD will be performed tomorrow, but she will need to be intubated.  It is anticipated that she will have a significant amount of retained food matter.  Plan: 1) EGD tomorrow.  ? Dilation.  Jerson Furukawa D 09/29/2023, 4:50 PM

## 2023-09-29 NOTE — Plan of Care (Signed)

## 2023-09-29 NOTE — TOC Initial Note (Signed)
 Transition of Care [facility]) - Initial/Assessment Note    Patient Details  Name: Megan Walton MRN: [id number] Date of Birth: [birth year]  Transition of [facility]) CM/SW Contact:    Larrie Kass, LCSW Phone Number: 09/29/2023, 3:02 PM  Clinical Narrative:                 CSW spoke with the Pt's mother, Megan Walton, who is the pt's legal guardian. CSW discussed the MOON notice. Pt's mother reported that the pt is from [facility] and that the plan is for the pt to return there. Pt's mother stated the family will provide transportation at the time of d/c. TOC to follow for needs.  Expected Discharge Plan: Group Home Barriers to Discharge: Continued Medical Work up   Patient Goals and CMS Choice Patient states their goals for this hospitalization and ongoing recovery are:: retrun to group home          Expected Discharge Plan and Services       Living arrangements for the past 2 months: Group Home                                      Prior Living Arrangements/Services Living arrangements for the past 2 months: Group Home Lives with:: Self Patient language and need for interpreter reviewed:: Yes Do you feel safe going back to the place where you live?: Yes      Need for Family Participation in Patient Care: Yes (Comment) Care giver support system in place?: Yes (comment)   Criminal Activity/Legal Involvement Pertinent to Current Situation/Hospitalization: No - Comment as needed  Activities of Daily Living   ADL Screening (condition at time of admission) Independently performs ADLs?: No Does the patient have a NEW difficulty with bathing/dressing/toileting/self-feeding that is expected to last >3 days?: No Does the patient have a NEW difficulty with getting in/out of bed, walking, or climbing stairs that is expected to last >3 days?: No Does the patient have a NEW difficulty with communication that is expected to last >3 days?:  No Is the patient deaf or have difficulty hearing?: No Does the patient have difficulty seeing, even when wearing glasses/contacts?: No Does the patient have difficulty concentrating, remembering, or making decisions?: Yes  Permission Sought/Granted                  Emotional Assessment Appearance:: Appears younger than stated age Attitude/Demeanor/Rapport: Unable to Assess Affect (typically observed): Unable to Assess        Admission diagnosis:  Influenza A [J10.1] Dysphagia [R13.10] Nausea and vomiting, unspecified vomiting type [R11.2] Patient Active Problem List   Diagnosis Date Noted   Dysphagia 09/28/2023   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

## 2023-09-29 NOTE — Progress Notes (Signed)
 PROGRESS NOTE    Megan Walton  NUU:[id number] DOB: 27-Oct-1970 DOA: 09/28/2023 PCP: Lucretia Field  Chief Complaint  Patient presents with   Emesis    Hospital Course:  Megan Walton is 53 y.o. 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  She was discharged back to the group home because she was otherwise stable.  She presented to the ED again on 3/19 due to inability to eat anything.  Group home staff reports she is vomiting everything she puts in.  She is having normal bowel movements. In the ED vitals and labs are grossly unremarkable.  Chest x-ray within normal limits.  CT abdomen pelvis consistent with dilated thoracic part of esophagus with possible food debris or stricture at the GE junction.  There is concern for possible food impaction or achalasia.  GI was consulted.  Patient was made n.p.o. and admitted for further workup.  Subjective: On evaluation this morning patient's mother is at bedside.  She reports that the patient continues to have some increased mucus production but has not had any further vomiting given that she is not attempted to eat anything. She reports the patient is drowsier than normal but also endorses that they were up all night and this level of drowsiness is not unexpected given her sleep. She is anxious to begin workup.  Objective: Vitals:   09/28/23 2057 09/28/23 2105 09/29/23 0054 09/29/23 0524  BP: (!) 112/97  102/86 101/86  Pulse: 100  90 80  Resp: 20  20 18   Temp: 98.9 F (37.2 C)  98.7 F (37.1 C) 97.8 F (36.6 C)  TempSrc: Oral  Oral Oral  SpO2: 100%  100% 100%  Weight: 46.4 kg     Height:  4\' 11"  (1.499 m)      Intake/Output Summary (Last 24 hours) at 09/29/2023 0754 Last data filed at 09/29/2023 0516 Gross per 24 hour  Intake 472.35 ml  Output --   Net 472.35 ml   Filed Weights   09/28/23 2057  Weight: 46.4 kg    Examination: General exam: Appears calm and comfortable, NAD  Respiratory system: No work of breathing, symmetric chest wall expansion, no wheeze, no rales Mouth: significant saliva production Cardiovascular system: S1 & S2 heard, RRR.  Gastrointestinal system: Abdomen is nondistended, soft and nontender.  Neuro: Drowsy, opens eyes when spoken to.  Does not participate in evaluation.  Does not follow commands. 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 for now - GI planning for endoscopy tomorrow with intubation. - Continue with maintenance IV fluids for now  Influenza A positive - Continue supportive care - No evidence of superimposed pneumonia at this time - Unclear if vomiting is related to influenza or if this is incidental finding. - Has mild leukocytosis, will trend CBC.  Afebrile currently - Given n.p.o. we will hold off on Tamiflu for now.  Generalized tonic-clonic seizure disorder - Given her current n.p.o. status we will convert all medications to IV. 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  Will require pelvic ultrasound for further  evaluation.  Will pursue when current workup is complete  DVT prophylaxis: SCDs for now   Code Status: Full Code Family Communication:  Discussed directly with patient's mother and sister  Disposition:  Inpatient still hospitalized for work up, EGD tomorrow. Will discharge to group home when medically stable  Consultants:    Procedures:    Antimicrobials:  Anti-infectives (From admission, onward)    None       Data Reviewed: I have personally reviewed following labs and imaging studies CBC: Recent Labs  Lab 09/28/23 1610  WBC 11.9*  NEUTROABS 9.9*  HGB 15.5*  HCT 46.5*  MCV 101.3*  PLT 465*   Basic Metabolic Panel: Recent Labs  Lab 09/28/23 1610  NA 145  K 3.5  CL 110  CO2 20*  GLUCOSE 112*  BUN 14  CREATININE 0.60  CALCIUM 9.5   GFR: Estimated Creatinine Clearance: 55.5 mL/min (by C-G formula based on SCr of 0.6 mg/dL). Liver Function Tests: Recent Labs  Lab 09/28/23 1610  AST 28  ALT 18  ALKPHOS 101  BILITOT 0.9  PROT 8.1  ALBUMIN 4.2   CBG: No results for input(s): "GLUCAP" in the last 168 hours.  Recent Results (from the past 240 hours)  Resp panel by RT-PCR (RSV, Flu A&B, Covid) Anterior Nasal Swab     Status: Abnormal   Collection Time: 09/28/23  3:58 PM   Specimen: Anterior Nasal Swab  Result Value Ref Range Status   SARS Coronavirus 2 by RT PCR NEGATIVE NEGATIVE Final    Comment: (NOTE) SARS-CoV-2 target nucleic acids are NOT DETECTED.  The SARS-CoV-2 RNA is generally detectable in upper respiratory specimens during the acute phase of infection. The lowest concentration of SARS-CoV-2 viral copies this assay can detect is 138 copies/mL. A negative result does not preclude SARS-Cov-2 infection and should not be used as the sole basis for treatment or other patient management decisions. A negative result may occur with  improper specimen collection/handling, submission of specimen other than nasopharyngeal swab, presence of viral  mutation(s) within the areas targeted by this assay, and inadequate number of viral copies(<138 copies/mL). A negative result must be combined with clinical observations, patient history, and epidemiological information. The expected result is Negative.  Fact Sheet for Patients:  BloggerCourse.com  Fact Sheet for Healthcare Providers:  SeriousBroker.it  This test is no t yet approved or cleared by the [place] FDA and  has been authorized for detection and/or diagnosis of SARS-CoV-2 by FDA under an Emergency Use Authorization (EUA). This EUA will remain  in effect (meaning this test can be used) for the duration of the COVID-19 declaration under Section 564(b)(1) of the Act, 21 U.S.C.section 360bbb-3(b)(1), unless the authorization is terminated  or revoked sooner.       Influenza A by PCR POSITIVE (A) NEGATIVE Final   Influenza B by PCR NEGATIVE NEGATIVE Final    Comment: (NOTE) The Xpert Xpress SARS-CoV-2/FLU/RSV plus assay is intended as an aid in the diagnosis of influenza from Nasopharyngeal swab specimens and should not be used as  a sole basis for treatment. Nasal washings and aspirates are unacceptable for Xpert Xpress SARS-CoV-2/FLU/RSV testing.  Fact Sheet for Patients: BloggerCourse.com  Fact Sheet for Healthcare Providers: SeriousBroker.it  This test is not yet approved or cleared by the [place] FDA and has been authorized for detection and/or diagnosis of SARS-CoV-2 by FDA under an Emergency Use Authorization (EUA). This EUA will remain in effect (meaning this test can be used) for the duration of the COVID-19 declaration under Section 564(b)(1) of the Act, 21 U.S.C. section 360bbb-3(b)(1), unless the authorization is terminated or revoked.     Resp Syncytial Virus by PCR NEGATIVE NEGATIVE Final    Comment: (NOTE) Fact Sheet for  Patients: BloggerCourse.com  Fact Sheet for Healthcare Providers: SeriousBroker.it  This test is not yet approved or cleared by the [place] FDA and has been authorized for detection and/or diagnosis of SARS-CoV-2 by FDA under an Emergency Use Authorization (EUA). This EUA will remain in effect (meaning this test can be used) for the duration of the COVID-19 declaration under Section 564(b)(1) of the Act, 21 U.S.C. section 360bbb-3(b)(1), unless the authorization is terminated or revoked.  Performed at [facility], [address], [place], [place] [address]      Radiology Studies: CT ABDOMEN PELVIS W CONTRAST Result Date: 09/28/2023 CLINICAL DATA:  Acute abdominal pain and vomiting beginning yesterday. EXAM: CT ABDOMEN AND PELVIS WITH CONTRAST TECHNIQUE: Multidetector CT imaging of the abdomen and pelvis was performed using the standard protocol following bolus administration of intravenous contrast. RADIATION DOSE REDUCTION: This exam was performed according to the departmental dose-optimization program which includes automated exposure control, adjustment of the mA and/or kV according to patient size and/or use of iterative reconstruction technique. CONTRAST:  80mL OMNIPAQUE IOHEXOL 300 MG/ML  SOLN COMPARISON:  None Available. FINDINGS: Lower Chest: Dilated thoracic esophagus is seen containing fluid and food stuff, with transition point at the GE junction. This could be due to esophageal stricture or achalasia. Lung bases are clear. Hepatobiliary: No suspicious hepatic masses identified. Gallbladder is unremarkable. No evidence of biliary ductal dilatation. Pancreas:  No mass or inflammatory changes. Spleen: Within normal limits in size and appearance. Adrenals/Urinary Tract: No suspicious masses identified. No evidence of ureteral calculi or hydronephrosis. Unremarkable unopacified urinary bladder. Stomach/Bowel: No  evidence of obstruction, inflammatory process or abnormal fluid collections. Normal appendix visualized. Vascular/Lymphatic: No pathologically enlarged lymph nodes. No acute vascular findings. Reproductive: 1 cm fibroid seen in the anterior uterine corpus. Heterogeneous low-attenuation lesion is seen in the left adnexa which measures 4 x 3 cm and has a somewhat serpiginous appearance best seen on coronal images. Differential diagnosis includes hydro-pyo-salpinx, endometrioma, and ovarian neoplasm. No evidence of peritoneal thickening or ascites. Other:  None. Musculoskeletal:  No suspicious bone lesions identified. IMPRESSION: Dilated thoracic esophagus containing fluid and food stuff, with transition point at the GE junction. This could be due to esophageal stricture or achalasia. 4 cm heterogeneous low-attenuation lesion in left adnexa. Differential diagnosis includes hydro-pyo-salpinx, endometrioma, and ovarian neoplasm. Recommend pelvic ultrasound for further characterization. Small uterine fibroid. Electronically Signed   By: Danae Orleans M.D.   On: 09/28/2023 18:11   DG Chest Portable 1 View Result Date: 09/28/2023 CLINICAL DATA:  Concern for aspiration pneumonia. EXAM: PORTABLE CHEST 1 VIEW COMPARISON:  Chest radiograph dated 09/27/2023 FINDINGS: No focal consolidation, pleural effusion, pneumothorax. The cardiac silhouette is within normal limits. No acute osseous pathology. IMPRESSION: No active disease. Electronically Signed   By: Ceasar Mons.D.  On: 09/28/2023 16:46   DG Chest 2 View Result Date: 09/27/2023 CLINICAL DATA:  Cough and vomiting. EXAM: CHEST - 2 VIEW COMPARISON:  Chest radiograph 10/08/2022 FINDINGS: The cardiomediastinal contours are normal. The lungs are clear. Pulmonary vasculature is normal. No consolidation, pleural effusion, or pneumothorax. No acute osseous abnormalities are seen. IMPRESSION: No active cardiopulmonary disease. Electronically Signed   By: Narda Rutherford  M.D.   On: 09/27/2023 21:07    Scheduled Meds:  carbamazepine  200 mg Oral BID   carbamazepine  300 mg Oral BID   phenytoin (DILANTIN) IV  50 mg Intravenous Q8H   Continuous Infusions:  dextrose 5 % and 0.45 % NaCl 40 mL/hr at 09/29/23 0516   levETIRAcetam Stopped (09/28/23 2244)   levETIRAcetam       LOS: 0 days  MDM: Patient is high risk for one or more organ failure.  They necessitate ongoing hospitalization for continued IV therapies and subsequent lab monitoring. Total time spent interpreting labs and vitals, coordinating care amongst consultants and care team members, directly assessing and discussing care with the patient and/or family: 55 min    Debarah Crape, DO [facility]  To contact the attending physician between 7A-7P please use Epic Chat. To contact the covering physician during after hours 7P-7A, please review Amion.   09/29/2023, 7:54 AM   *This document has been created with the assistance of dictation software. Please excuse typographical errors. *

## 2023-09-29 NOTE — Progress Notes (Signed)
 Patient's mother (guardian) refused PO Tegretol due to previous inability to swallow anything. Pt received other IV seizure medications as ordered.

## 2023-09-29 NOTE — H&P (View-Only) (Signed)
 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  She acutely had nausea and vomiting this past Tuesday, but she was sent home.  Unfortunately she was not able to tolerate any PO and she represented in Wednesday evening with a subsequent admission.  The CT scan of the abdomen showed a dilated thoracic esophagus with a narrowing at the GE junction.  It was not known if this was a stricture versus achalasia.  The patient does not have any prior issues with dysphagia.  Per her sister, this is a completely new issue, but her sister recalls a Speech Path evaluation 6 months ago.  However, she was cleared by Speech Path and she was able to eat.  Past Medical History:  Diagnosis Date   Constipation, chronic    Developmental delay    Foot fracture    Herpes simplex type 1 antibody positive    Hypothyroidism    Mental retardation    Mental retardation    MR (mental retardation)    profound   Osteopenia    Right Hip   Rett syndrome    Rett's syndrome    Rett's syndrome    Seizures (HCC)    Thyroid disease     History reviewed. No pertinent surgical history.  Family History  Problem Relation Age of Onset   Lymphoma Father    Healthy Mother    Healthy Sister    ADD / ADHD Brother    Healthy Brother     Social History:  reports that she has never smoked. She has never used smokeless tobacco. She reports that she does not drink alcohol and does not use drugs.  Allergies:  Allergies  Allergen Reactions   Valproic Acid Other (See Comments)    Unknown. Reaction not listed on MAR    Cephalexin Other (See Comments)    Unknown. Reaction not listed on MAR    Clarithromycin Other (See Comments)    Unknown. Reaction not listed on MAR    Macrolides And Ketolides Other (See Comments)    Unknown. Reaction not listed on MAR    Penicillins  Other (See Comments)    Unknown. Reaction not listed on MAR     Medications: Scheduled:  carbamazepine  200 mg Oral BID   carbamazepine  300 mg Oral BID   [START ON 09/30/2023] phenytoin (DILANTIN) IV  100 mg Intravenous Daily   phenytoin (DILANTIN) IV  50 mg Intravenous Q12H   Continuous:  dextrose 5 % and 0.45 % NaCl Stopped (09/29/23 1647)   lactated ringers 100 mL/hr at 09/29/23 1647   levETIRAcetam Stopped (09/28/23 2244)   levETIRAcetam Stopped (09/29/23 1009)    Results for orders placed or performed during the hospital encounter of 09/28/23 (from the past 24 hours)  HIV Antibody (routine testing w rflx)     Status: None   Collection Time: 09/29/23  9:01 AM  Result Value Ref Range   HIV Screen 4th Generation wRfx Non Reactive Non Reactive  CBC with Differential/Platelet     Status: Abnormal   Collection Time: 09/29/23  9:01 AM  Result Value Ref Range   WBC 14.4 (H) 4.0 - 10.5 K/uL   RBC 4.52 3.87 - 5.11 MIL/uL   Hemoglobin 15.1 (H) 12.0 - 15.0 g/dL   HCT 01.6 (H) 01.0 - 93.2 %   MCV 104.0 (H) 80.0 - 100.0 fL  MCH 33.4 26.0 - 34.0 pg   MCHC 32.1 30.0 - 36.0 g/dL   RDW 41.3 24.4 - 01.0 %   Platelets 321 150 - 400 K/uL   nRBC 0.0 0.0 - 0.2 %   Neutrophils Relative % 76 %   Neutro Abs 10.8 (H) 1.7 - 7.7 K/uL   Lymphocytes Relative 15 %   Lymphs Abs 2.2 0.7 - 4.0 K/uL   Monocytes Relative 9 %   Monocytes Absolute 1.3 (H) 0.1 - 1.0 K/uL   Eosinophils Relative 0 %   Eosinophils Absolute 0.0 0.0 - 0.5 K/uL   Basophils Relative 0 %   Basophils Absolute 0.0 0.0 - 0.1 K/uL   Immature Granulocytes 0 %   Abs Immature Granulocytes 0.05 0.00 - 0.07 K/uL  Comprehensive metabolic panel     Status: Abnormal   Collection Time: 09/29/23  9:01 AM  Result Value Ref Range   Sodium 143 135 - 145 mmol/L   Potassium 3.3 (L) 3.5 - 5.1 mmol/L   Chloride 111 98 - 111 mmol/L   CO2 19 (L) 22 - 32 mmol/L   Glucose, Bld 95 70 - 99 mg/dL   BUN 11 6 - 20 mg/dL   Creatinine, Ser 2.72 0.44  - 1.00 mg/dL   Calcium 8.7 (L) 8.9 - 10.3 mg/dL   Total Protein 7.1 6.5 - 8.1 g/dL   Albumin 3.7 3.5 - 5.0 g/dL   AST 24 15 - 41 U/L   ALT 17 0 - 44 U/L   Alkaline Phosphatase 93 38 - 126 U/L   Total Bilirubin 0.8 0.0 - 1.2 mg/dL   GFR, Estimated >53 >66 mL/min   Anion gap 13 5 - 15     CT ABDOMEN PELVIS W CONTRAST Result Date: 09/28/2023 CLINICAL DATA:  Acute abdominal pain and vomiting beginning yesterday. EXAM: CT ABDOMEN AND PELVIS WITH CONTRAST TECHNIQUE: Multidetector CT imaging of the abdomen and pelvis was performed using the standard protocol following bolus administration of intravenous contrast. RADIATION DOSE REDUCTION: This exam was performed according to the departmental dose-optimization program which includes automated exposure control, adjustment of the mA and/or kV according to patient size and/or use of iterative reconstruction technique. CONTRAST:  80mL OMNIPAQUE IOHEXOL 300 MG/ML  SOLN COMPARISON:  None Available. FINDINGS: Lower Chest: Dilated thoracic esophagus is seen containing fluid and food stuff, with transition point at the GE junction. This could be due to esophageal stricture or achalasia. Lung bases are clear. Hepatobiliary: No suspicious hepatic masses identified. Gallbladder is unremarkable. No evidence of biliary ductal dilatation. Pancreas:  No mass or inflammatory changes. Spleen: Within normal limits in size and appearance. Adrenals/Urinary Tract: No suspicious masses identified. No evidence of ureteral calculi or hydronephrosis. Unremarkable unopacified urinary bladder. Stomach/Bowel: No evidence of obstruction, inflammatory process or abnormal fluid collections. Normal appendix visualized. Vascular/Lymphatic: No pathologically enlarged lymph nodes. No acute vascular findings. Reproductive: 1 cm fibroid seen in the anterior uterine corpus. Heterogeneous low-attenuation lesion is seen in the left adnexa which measures 4 x 3 cm and has a somewhat serpiginous  appearance best seen on coronal images. Differential diagnosis includes hydro-pyo-salpinx, endometrioma, and ovarian neoplasm. No evidence of peritoneal thickening or ascites. Other:  None. Musculoskeletal:  No suspicious bone lesions identified. IMPRESSION: Dilated thoracic esophagus containing fluid and food stuff, with transition point at the GE junction. This could be due to esophageal stricture or achalasia. 4 cm heterogeneous low-attenuation lesion in left adnexa. Differential diagnosis includes hydro-pyo-salpinx, endometrioma, and ovarian neoplasm. Recommend pelvic ultrasound for further  characterization. Small uterine fibroid. Electronically Signed   By: Danae Orleans M.D.   On: 09/28/2023 18:11   DG Chest Portable 1 View Result Date: 09/28/2023 CLINICAL DATA:  Concern for aspiration pneumonia. EXAM: PORTABLE CHEST 1 VIEW COMPARISON:  Chest radiograph dated 09/27/2023 FINDINGS: No focal consolidation, pleural effusion, pneumothorax. The cardiac silhouette is within normal limits. No acute osseous pathology. IMPRESSION: No active disease. Electronically Signed   By: Elgie Collard M.D.   On: 09/28/2023 16:46   DG Chest 2 View Result Date: 09/27/2023 CLINICAL DATA:  Cough and vomiting. EXAM: CHEST - 2 VIEW COMPARISON:  Chest radiograph 10/08/2022 FINDINGS: The cardiomediastinal contours are normal. The lungs are clear. Pulmonary vasculature is normal. No consolidation, pleural effusion, or pneumothorax. No acute osseous abnormalities are seen. IMPRESSION: No active cardiopulmonary disease. Electronically Signed   By: Narda Rutherford M.D.   On: 09/27/2023 21:07    ROS:  As stated above in the HPI otherwise negative.  Blood pressure 120/86, pulse 82, temperature 98.6 F (37 C), temperature source Oral, resp. rate 18, height 4\' 11"  (1.499 m), weight 46.4 kg, SpO2 100%.    PE: Gen: NAD, Alert HEENT:  /AT, EOMI Neck: Supple, no LAD Lungs: CTA Bilaterally CV: RRR without M/G/R ABD: Soft,  NTND, +BS Ext: No C/C/E  Assessment/Plan: 1) Distal esophageal stricture of unknown etiology. 2) Nausea/vomiting. 3) Rett's syndrome.   She is stable.  There is no issue with aspiration, however, she was noted to have influenza.  An EGD will be performed tomorrow, but she will need to be intubated.  It is anticipated that she will have a significant amount of retained food matter.  Plan: 1) EGD tomorrow.  ? Dilation.  Jerson Furukawa D 09/29/2023, 4:50 PM

## 2023-09-29 NOTE — Plan of Care (Signed)

## 2023-09-29 NOTE — Care Management Obs Status (Signed)
 MEDICARE OBSERVATION STATUS NOTIFICATION   Patient Details  Name: Megan Walton MRN: [id number] Date of Birth: [birth year]   Medicare Observation Status Notification Given:  Yes    Larrie Kass, LCSW 09/29/2023, 2:59 PM

## 2023-09-30 ENCOUNTER — Inpatient Hospital Stay (HOSPITAL_COMMUNITY)

## 2023-09-30 ENCOUNTER — Encounter (HOSPITAL_COMMUNITY)
Admission: EM | Disposition: A | Discharge status: Home / Self Care | Payer: Self-pay | Source: Home / Self Care | Attending: Family Medicine

## 2023-09-30 DIAGNOSIS — T18128A Food in esophagus causing other injury, initial encounter: Secondary | ICD-10-CM | POA: Diagnosis not present

## 2023-09-30 DIAGNOSIS — K222 Esophageal obstruction: Secondary | ICD-10-CM | POA: Diagnosis not present

## 2023-09-30 DIAGNOSIS — G40919 Epilepsy, unspecified, intractable, without status epilepticus: Secondary | ICD-10-CM | POA: Diagnosis not present

## 2023-09-30 DIAGNOSIS — G40309 Generalized idiopathic epilepsy and epileptic syndromes, not intractable, without status epilepticus: Secondary | ICD-10-CM | POA: Diagnosis not present

## 2023-09-30 DIAGNOSIS — F842 Rett's syndrome: Secondary | ICD-10-CM | POA: Diagnosis not present

## 2023-09-30 DIAGNOSIS — E039 Hypothyroidism, unspecified: Secondary | ICD-10-CM

## 2023-09-30 DIAGNOSIS — R1319 Other dysphagia: Secondary | ICD-10-CM | POA: Diagnosis not present

## 2023-09-30 HISTORY — PX: ESOPHAGOGASTRODUODENOSCOPY: SHX5428

## 2023-09-30 HISTORY — PX: FOREIGN BODY REMOVAL: SHX962

## 2023-09-30 LAB — CBC WITH DIFFERENTIAL/PLATELET
Abs Immature Granulocytes: 0.05 10*3/uL (ref 0.00–0.07)
Basophils Absolute: 0 10*3/uL (ref 0.0–0.1)
Basophils Relative: 0 %
Eosinophils Absolute: 0.1 10*3/uL (ref 0.0–0.5)
Eosinophils Relative: 1 %
HCT: 39.5 % (ref 36.0–46.0)
Hemoglobin: 12.9 g/dL (ref 12.0–15.0)
Immature Granulocytes: 0 %
Lymphocytes Relative: 19 %
Lymphs Abs: 2.2 10*3/uL (ref 0.7–4.0)
MCH: 33.4 pg (ref 26.0–34.0)
MCHC: 32.7 g/dL (ref 30.0–36.0)
MCV: 102.3 fL — ABNORMAL HIGH (ref 80.0–100.0)
Monocytes Absolute: 1 10*3/uL (ref 0.1–1.0)
Monocytes Relative: 9 %
Neutro Abs: 8.2 10*3/uL — ABNORMAL HIGH (ref 1.7–7.7)
Neutrophils Relative %: 71 %
Platelets: 282 10*3/uL (ref 150–400)
RBC: 3.86 MIL/uL — ABNORMAL LOW (ref 3.87–5.11)
RDW: 12 % (ref 11.5–15.5)
WBC: 11.7 10*3/uL — ABNORMAL HIGH (ref 4.0–10.5)
nRBC: 0 % (ref 0.0–0.2)

## 2023-09-30 LAB — COMPREHENSIVE METABOLIC PANEL
ALT: 17 U/L (ref 0–44)
AST: 22 U/L (ref 15–41)
Albumin: 2.9 g/dL — ABNORMAL LOW (ref 3.5–5.0)
Alkaline Phosphatase: 83 U/L (ref 38–126)
Anion gap: 7 (ref 5–15)
BUN: 7 mg/dL (ref 6–20)
CO2: 21 mmol/L — ABNORMAL LOW (ref 22–32)
Calcium: 8.4 mg/dL — ABNORMAL LOW (ref 8.9–10.3)
Chloride: 112 mmol/L — ABNORMAL HIGH (ref 98–111)
Creatinine, Ser: 0.43 mg/dL — ABNORMAL LOW (ref 0.44–1.00)
GFR, Estimated: >60 mL/min (ref >60)
Glucose, Bld: 89 mg/dL (ref 70–99)
Potassium: 4 mmol/L (ref 3.5–5.1)
Sodium: 140 mmol/L (ref 135–145)
Total Bilirubin: 0.6 mg/dL (ref 0.0–1.2)
Total Protein: 5.8 g/dL — ABNORMAL LOW (ref 6.5–8.1)

## 2023-09-30 SURGERY — EGD (ESOPHAGOGASTRODUODENOSCOPY)
Anesthesia: General

## 2023-09-30 MED ORDER — ESMOLOL HCL 100 MG/10ML IV SOLN
INTRAVENOUS | Status: DC | PRN
  Administered 2023-09-30: 20 mg via INTRAVENOUS

## 2023-09-30 MED ORDER — ONDANSETRON HCL 4 MG/2ML IJ SOLN
INTRAMUSCULAR | Status: DC | PRN
  Administered 2023-09-30: 4 mg via INTRAVENOUS

## 2023-09-30 MED ORDER — MIDAZOLAM HCL 5 MG/5ML IJ SOLN
INTRAMUSCULAR | Status: DC | PRN
  Administered 2023-09-30: 140 mg via INTRAVENOUS
  Administered 2023-09-30: 1 mg via INTRAVENOUS

## 2023-09-30 MED ORDER — PROPOFOL 10 MG/ML IV BOLUS
INTRAVENOUS | Status: DC | PRN
  Administered 2023-09-30: 140 mg via INTRAVENOUS

## 2023-09-30 MED ORDER — ALBUMIN HUMAN 5 % IV SOLN
INTRAVENOUS | Status: AC
  Filled 2023-09-30: qty 250

## 2023-09-30 MED ORDER — MIDAZOLAM HCL 2 MG/2ML IJ SOLN
INTRAMUSCULAR | Status: AC
  Filled 2023-09-30: qty 2

## 2023-09-30 MED ORDER — PROPOFOL 10 MG/ML IV BOLUS
INTRAVENOUS | Status: AC
  Filled 2023-09-30: qty 20

## 2023-09-30 MED ORDER — ALBUMIN HUMAN 5 % IV SOLN: INTRAVENOUS | Status: DC | PRN

## 2023-09-30 MED ORDER — FENTANYL CITRATE (PF) 100 MCG/2ML IJ SOLN
INTRAMUSCULAR | Status: AC
  Filled 2023-09-30: qty 2

## 2023-09-30 MED ORDER — ROCURONIUM BROMIDE 100 MG/10ML IV SOLN
INTRAVENOUS | Status: DC | PRN
  Administered 2023-09-30: 70 mg via INTRAVENOUS

## 2023-09-30 MED ORDER — FENTANYL CITRATE (PF) 100 MCG/2ML IJ SOLN
INTRAMUSCULAR | Status: DC | PRN
  Administered 2023-09-30: 50 ug via INTRAVENOUS

## 2023-09-30 MED ORDER — SUGAMMADEX SODIUM 200 MG/2ML IV SOLN
INTRAVENOUS | Status: DC | PRN
  Administered 2023-09-30: 200 mg via INTRAVENOUS

## 2023-09-30 MED ORDER — PANTOPRAZOLE SODIUM 40 MG PO TBEC
40.0000 mg | DELAYED_RELEASE_TABLET | Freq: Two times a day (BID) | ORAL | Status: DC
  Filled 2023-09-30: qty 1

## 2023-09-30 MED ORDER — LIDOCAINE HCL (CARDIAC) PF 100 MG/5ML IV SOSY
PREFILLED_SYRINGE | INTRAVENOUS | Status: DC | PRN
  Administered 2023-09-30: 40 mg via INTRAVENOUS

## 2023-09-30 NOTE — Plan of Care (Signed)

## 2023-09-30 NOTE — Op Note (Addendum)
 [facility] Patient Name: Megan Walton Procedure Date: 09/30/2023 MRN: [id number] Attending MD: Megan Walton , MD, [id number] Date of Birth: 06-Jan-1971 CSN: [id number] Age: 53 Admit Type: Inpatient Procedure:                Upper GI endoscopy Indications:              Dysphagia, Abnormal CT of the GI tract Providers:                Megan Hawking, MD, Megan Bouchard, RN, Megan Walton,                            Technician Referring MD:              Medicines:                General Anesthesia Complications:            No immediate complications. Estimated Blood Loss:     Estimated blood loss: none. Procedure:                Pre-Anesthesia Assessment:                           - Prior to the procedure, a History and Physical                            was performed, and patient medications and                            allergies were reviewed. The patient's tolerance of                            previous anesthesia was also reviewed. The risks                            and benefits of the procedure and the sedation                            options and risks were discussed with the patient.                            All questions were answered, and informed consent                            was obtained. Prior Anticoagulants: The patient has                            taken no anticoagulant or antiplatelet agents. ASA                            Grade Assessment: III - A patient with severe                            systemic disease. After reviewing the risks and  benefits, the patient was deemed in satisfactory                            condition to undergo the procedure.                           - Sedation was administered by an anesthesia                            professional. General anesthesia was attained.                           After obtaining informed consent, the endoscope was                            passed under direct  vision. Throughout the                            procedure, the patient's blood pressure, pulse, and                            oxygen saturations were monitored continuously. The                            [id number]-[id number] ([id number]) Olympus endoscope was introduced                            through the mouth, and advanced to the second part                            of duodenum. The upper GI endoscopy was technically                            difficult and complex. The patient tolerated the                            procedure well. Scope In: Scope Out: Findings:      Food was found in the entire esophagus.      One benign-appearing, intrinsic mild stenosis was found. This stenosis       measured 1 cm (inner diameter) x 1 cm (in length). The stenosis was       traversed.      LA Grade D (one or more mucosal breaks involving at least 75% of       esophageal circumference) esophagitis with no bleeding was found at the       gastroesophageal junction.      The stomach was normal.      The examined duodenum was normal.      In the esophagus there was a large volume of solid food matter. The       endoscope was able to pass along side the food and evaluate the GE       junction. There was evidence of a stenosis and esophagitis. Starting       distally, the solid food material was gently pushed through the GE       junction  into the esophagus. There was some resistance, but the passage       was complete. As a result this dilated the stricture. More food material       was pushed through, but a Megan Walton net was employed to Megan Walton.       Multiple large pieces of meat, ? chicken, were removed with the Roth       net. Complete clearance was obtained. There was no evidence of any       secondary achalasia. Impression:               - Food in the esophagus.                           - Benign-appearing esophageal stenosis.                           - LA Grade D reflux esophagitis with no  bleeding.                           - Normal stomach.                           - Normal examined duodenum.                           - No specimens collected. Moderate Sedation:      Not Applicable - Patient had care per Anesthesia. Recommendation:           - Return patient to hospital ward for ongoing care.                           - Clear liquid diet and advance to puree as                            tolerated.                           - Continue present medications.                           - Pantoprazole 40 mg PO BID x 1 month and then QD.                            Any PPI, as an outpatient can be used.                           - Cut food into very small portions and/or puree. Procedure Code(s):        --- Professional ---                           [phone], Esophagogastroduodenoscopy, flexible,                            transoral; diagnostic, including collection of  specimen(s) by brushing or washing, when performed                            (separate procedure) Diagnosis Code(s):        --- Professional ---                           K22.2, Esophageal obstruction                           V40.981X, Food in esophagus causing other injury,                            initial encounter                           K21.00, Gastro-esophageal reflux disease with                            esophagitis, without bleeding                           R13.10, Dysphagia, unspecified                           R93.3, Abnormal findings on diagnostic imaging of                            other parts of digestive tract CPT copyright 2022 [employer]. All rights reserved. The codes documented in this report are preliminary and upon coder review may  be revised to meet current compliance requirements. Megan Hawking, MD Megan Hawking, MD 09/30/2023 4:45:42 PM This report has been signed electronically. Number of Addenda: 0

## 2023-09-30 NOTE — Progress Notes (Signed)
 PROGRESS NOTE    Megan Walton  Megan Walton:[id number] DOB: 11/13/1970 DOA: 09/28/2023 PCP: Lucretia Field  Chief Complaint  Patient presents with   Emesis    Hospital Course:  Megan Walton is 53 y.o. 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  She was discharged back to the group home because she was otherwise stable.  She presented to the ED again on 3/19 due to inability to eat anything.  Group home staff reports she is vomiting everything she puts in.  She is having normal bowel movements. In the ED vitals and labs are grossly unremarkable.  Chest x-ray within normal limits.  CT abdomen pelvis consistent with dilated thoracic part of esophagus with possible food debris or stricture at the GE junction.  There is concern for possible food impaction or achalasia.  GI was consulted.  Patient was made n.p.o. and admitted for further workup.  Subjective: No acute events overnight. On evaluation today patient is more alert, patient's mother and sister at bedside and agree that she seems improved.  Minimal coughing overnight.  Sputum production has decreased.  Her mom does report that patient appears to have foul odor coming from her mouth.   Objective: Vitals:   09/29/23 1238 09/29/23 2024 09/29/23 2100 09/30/23 0536  BP: 120/86 118/73  112/82  Pulse: 82 67  67  Resp:  16 (!) 21 18  Temp: 98.6 F (37 C) 98.2 F (36.8 C)  99 F (37.2 C)  TempSrc: Oral Oral    SpO2: 100% 99%  99%  Weight:      Height:        Intake/Output Summary (Last 24 hours) at 09/30/2023 0820 Last data filed at 09/30/2023 0302 Gross per 24 hour  Intake 1668.52 ml  Output 250 ml  Net 1418.52 ml   Filed Weights   09/28/23 2057  Weight: 46.4 kg    Examination: General exam: Appears calm and comfortable, NAD  Respiratory  system: No work of breathing, symmetric chest wall expansion, no wheeze, no rales Mouth: significant saliva production Cardiovascular system: S1 & S2 heard, RRR.  Gastrointestinal system: Abdomen is nondistended, soft and nontender.  Neuro: Alert, tracks.  Does not participate in evaluation.  Does not follow commands. 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 for now, advance diet as tolerated and advised by GI. - GI planning for endoscopy today with intubation -- SLP eval when cleared. History of normal SLP eval 1 yr ago, though I maintain concern for aspiration. - Continue with maintenance IV fluids for now  Influenza A positive - Continue supportive care - She appears mostly asymptomatic from this standpoint. - No evidence of superimposed pneumonia at this time - Has mild leukocytosis, will trend CBC.  Afebrile currently - Given n.p.o. we will hold off on Tamiflu for now.  Generalized tonic-clonic seizure disorder - Given her current n.p.o. status we will convert all medications to IV. 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  Will require pelvic ultrasound for further evaluation.  Will pursue when current  workup is complete. Have discussed with mother for workup.  DVT prophylaxis: SCDs for now   Code Status: Full Code Family Communication:  Discussed directly with patient's mother and sister  Disposition:  Inpatient still hospitalized for work up, EGD today. Will discharge to group home when medically stable  Consultants:  Treatment Team:  Consulting Physician: Jeani Hawking, MD  Procedures:    Antimicrobials:  Anti-infectives (From admission, onward)    None       Data Reviewed: I have personally reviewed following labs and imaging studies CBC: Recent Labs  Lab 09/28/23 1610 09/29/23 0901  WBC 11.9* 14.4*  NEUTROABS 9.9* 10.8*  HGB 15.5* 15.1*  HCT 46.5* 47.0*  MCV 101.3* 104.0*  PLT 465* 321   Basic Metabolic Panel: Recent Labs  Lab 09/28/23 1610 09/29/23 0901  NA 145 143  K 3.5 3.3*  CL 110 111  CO2 20* 19*  GLUCOSE 112* 95  BUN 14 11  CREATININE 0.60 0.49  CALCIUM 9.5 8.7*   GFR: Estimated Creatinine Clearance: 55.5 mL/min (by C-G formula based on SCr of 0.49 mg/dL). Liver Function Tests: Recent Labs  Lab 09/28/23 1610 09/29/23 0901  AST 28 24  ALT 18 17  ALKPHOS 101 93  BILITOT 0.9 0.8  PROT 8.1 7.1  ALBUMIN 4.2 3.7   CBG: No results for input(s): "GLUCAP" in the last 168 hours.  Recent Results (from the past 240 hours)  Resp panel by RT-PCR (RSV, Flu A&B, Covid) Anterior Nasal Swab     Status: Abnormal   Collection Time: 09/28/23  3:58 PM   Specimen: Anterior Nasal Swab  Result Value Ref Range Status   SARS Coronavirus 2 by RT PCR NEGATIVE NEGATIVE Final    Comment: (NOTE) SARS-CoV-2 target nucleic acids are NOT DETECTED.  The SARS-CoV-2 RNA is generally detectable in upper respiratory specimens during the acute phase of infection. The lowest concentration of SARS-CoV-2 viral copies this assay can detect is 138 copies/mL. A negative result does not preclude SARS-Cov-2 infection and should not be used as the sole basis for treatment  or other patient management decisions. A negative result may occur with  improper specimen collection/handling, submission of specimen other than nasopharyngeal swab, presence of viral mutation(s) within the areas targeted by this assay, and inadequate number of viral copies(<138 copies/mL). A negative result must be combined with clinical observations, patient history, and epidemiological information. The expected result is Negative.  Fact Sheet for Patients:  BloggerCourse.com  Fact Sheet for Healthcare Providers:  SeriousBroker.it  This test is no t yet approved or cleared by the [place] FDA and  has been authorized for detection and/or diagnosis of SARS-CoV-2 by FDA under an Emergency Use Authorization (EUA). This EUA will remain  in effect (meaning this test can be used) for the duration of the COVID-19 declaration under Section 564(b)(1) of the Act, 21 U.S.C.section 360bbb-3(b)(1), unless the authorization is terminated  or revoked sooner.       Influenza A by PCR POSITIVE (A) NEGATIVE Final   Influenza B by PCR NEGATIVE NEGATIVE  Final    Comment: (NOTE) The Xpert Xpress SARS-CoV-2/FLU/RSV plus assay is intended as an aid in the diagnosis of influenza from Nasopharyngeal swab specimens and should not be used as a sole basis for treatment. Nasal washings and aspirates are unacceptable for Xpert Xpress SARS-CoV-2/FLU/RSV testing.  Fact Sheet for Patients: BloggerCourse.com  Fact Sheet for Healthcare Providers: SeriousBroker.it  This test is not yet approved or cleared by the [place] FDA and has been authorized for detection and/or diagnosis of SARS-CoV-2 by FDA under an Emergency Use Authorization (EUA). This EUA will remain in effect (meaning this test can be used) for the duration of the COVID-19 declaration under Section 564(b)(1) of the Act, 21  U.S.C. section 360bbb-3(b)(1), unless the authorization is terminated or revoked.     Resp Syncytial Virus by PCR NEGATIVE NEGATIVE Final    Comment: (NOTE) Fact Sheet for Patients: BloggerCourse.com  Fact Sheet for Healthcare Providers: SeriousBroker.it  This test is not yet approved or cleared by the [place] FDA and has been authorized for detection and/or diagnosis of SARS-CoV-2 by FDA under an Emergency Use Authorization (EUA). This EUA will remain in effect (meaning this test can be used) for the duration of the COVID-19 declaration under Section 564(b)(1) of the Act, 21 U.S.C. section 360bbb-3(b)(1), unless the authorization is terminated or revoked.  Performed at [facility], [address] [place], [place] [address]      Radiology Studies: CT ABDOMEN PELVIS W CONTRAST Result Date: 09/28/2023 CLINICAL DATA:  Acute abdominal pain and vomiting beginning yesterday. EXAM: CT ABDOMEN AND PELVIS WITH CONTRAST TECHNIQUE: Multidetector CT imaging of the abdomen and pelvis was performed using the standard protocol following bolus administration of intravenous contrast. RADIATION DOSE REDUCTION: This exam was performed according to the departmental dose-optimization program which includes automated exposure control, adjustment of the mA and/or kV according to patient size and/or use of iterative reconstruction technique. CONTRAST:  80mL OMNIPAQUE IOHEXOL 300 MG/ML  SOLN COMPARISON:  None Available. FINDINGS: Lower Chest: Dilated thoracic esophagus is seen containing fluid and food stuff, with transition point at the GE junction. This could be due to esophageal stricture or achalasia. Lung bases are clear. Hepatobiliary: No suspicious hepatic masses identified. Gallbladder is unremarkable. No evidence of biliary ductal dilatation. Pancreas:  No mass or inflammatory changes. Spleen: Within normal limits in size and  appearance. Adrenals/Urinary Tract: No suspicious masses identified. No evidence of ureteral calculi or hydronephrosis. Unremarkable unopacified urinary bladder. Stomach/Bowel: No evidence of obstruction, inflammatory process or abnormal fluid collections. Normal appendix visualized. Vascular/Lymphatic: No pathologically enlarged lymph nodes. No acute vascular findings. Reproductive: 1 cm fibroid seen in the anterior uterine corpus. Heterogeneous low-attenuation lesion is seen in the left adnexa which measures 4 x 3 cm and has a somewhat serpiginous appearance best seen on coronal images. Differential diagnosis includes hydro-pyo-salpinx, endometrioma, and ovarian neoplasm. No evidence of peritoneal thickening or ascites. Other:  None. Musculoskeletal:  No suspicious bone lesions identified. IMPRESSION: Dilated thoracic esophagus containing fluid and food stuff, with transition point at the GE junction. This could be due to esophageal stricture or achalasia. 4 cm heterogeneous low-attenuation lesion in left adnexa. Differential diagnosis includes hydro-pyo-salpinx, endometrioma, and ovarian neoplasm. Recommend pelvic ultrasound for further characterization. Small uterine fibroid. Electronically Signed   By: Danae Orleans M.D.   On: 09/28/2023 18:11   DG Chest Portable 1 View Result Date: 09/28/2023 CLINICAL DATA:  Concern for aspiration pneumonia. EXAM: PORTABLE CHEST 1 VIEW COMPARISON:  Chest radiograph dated 09/27/2023 FINDINGS:  No focal consolidation, pleural effusion, pneumothorax. The cardiac silhouette is within normal limits. No acute osseous pathology. IMPRESSION: No active disease. Electronically Signed   By: Elgie Collard M.D.   On: 09/28/2023 16:46    Scheduled Meds:  carbamazepine  200 mg Oral BID   carbamazepine  300 mg Oral BID   phenytoin (DILANTIN) IV  100 mg Intravenous Daily   Continuous Infusions:  lactated ringers 100 mL/hr at 09/30/23 0302   levETIRAcetam Stopped (09/29/23 1834)    levETIRAcetam Stopped (09/29/23 1009)     LOS: 1 day  MDM: Patient is high risk for one or more organ failure.  They necessitate ongoing hospitalization for continued IV therapies and subsequent lab monitoring. Total time spent interpreting labs and vitals, coordinating care amongst consultants and care team members, directly assessing and discussing care with the patient and/or family: 55 min    Debarah Crape, DO [facility]  To contact the attending physician between 7A-7P please use Epic Chat. To contact the covering physician during after hours 7P-7A, please review Amion.   09/30/2023, 8:20 AM   *This document has been created with the assistance of dictation software. Please excuse typographical errors. *

## 2023-09-30 NOTE — Anesthesia Procedure Notes (Signed)
 Procedure Name: Intubation Date/Time: 09/30/2023 4:17 PM  Performed by: Garth Bigness, CRNAPre-anesthesia Checklist: Patient identified, Emergency Drugs available, Suction available and Patient being monitored Patient Re-evaluated:Patient Re-evaluated prior to induction Oxygen Delivery Method: Circle system utilized Preoxygenation: Pre-oxygenation with 100% oxygen Induction Type: IV induction, Cricoid Pressure applied and Rapid sequence Ventilation: Mask ventilation without difficulty Laryngoscope Size: Mac and 3 Grade View: Grade II Tube type: Oral Tube size: 7.0 mm Number of attempts: 1 Airway Equipment and Method: Stylet Placement Confirmation: ETT inserted through vocal cords under direct vision, positive ETCO2 and breath sounds checked- equal and bilateral Secured at: 21 cm Tube secured with: Tape Dental Injury: Teeth and Oropharynx as per pre-operative assessment

## 2023-09-30 NOTE — Transfer of Care (Signed)
 Immediate Anesthesia Transfer of Care Note  Patient: Megan Walton  Procedure(s) Performed: EGD (ESOPHAGOGASTRODUODENOSCOPY) REMOVAL, FOREIGN BODY  Patient Location: Endoscopy Unit  Anesthesia Type:General  Level of Consciousness: awake, alert , oriented, and patient cooperative  Airway & Oxygen Therapy: Patient Spontanous Breathing and Patient connected to face mask oxygen  Post-op Assessment: Report given to RN and Post -op Vital signs reviewed and stable  Post vital signs: Reviewed and stable  Last Vitals:  Vitals Value Taken Time  BP 115/71 09/30/23 1700  Temp    Pulse 81 09/30/23 1701  Resp 17 09/30/23 1701  SpO2 100 % 09/30/23 1701  Vitals shown include unfiled device data.  Last Pain:  Vitals:   09/30/23 1659  TempSrc: (P) Temporal  PainSc: (P) 0-No pain         Complications: No notable events documented.

## 2023-09-30 NOTE — Interval H&P Note (Signed)
 History and Physical Interval Note:  09/30/2023 4:01 PM  Megan Walton  has presented today for surgery, with the diagnosis of Esophageal stricture.  The various methods of treatment have been discussed with the patient and family. After consideration of risks, benefits and other options for treatment, the patient has consented to  Procedure(s): EGD (ESOPHAGOGASTRODUODENOSCOPY) (N/A) as a surgical intervention.  The patient's history has been reviewed, patient examined, no change in status, stable for surgery.  I have reviewed the patient's chart and labs.  Questions were answered to the patient's satisfaction.     Curley Fayette D

## 2023-09-30 NOTE — Anesthesia Preprocedure Evaluation (Addendum)
 Anesthesia Evaluation  Patient identified by MRN, date of birth, ID band Patient awake    Reviewed: Allergy & Precautions, H&P , NPO status , Patient's Chart, lab work & pertinent test results  Airway Mallampati: II  TM Distance: >3 FB Neck ROM: Full    Dental  (+) Missing, Poor Dentition   Pulmonary neg pulmonary ROS   breath sounds clear to auscultation       Cardiovascular negative cardio ROS Normal cardiovascular exam Rhythm:Regular Rate:Normal     Neuro/Psych Seizures -,  PSYCHIATRIC DISORDERS      Intractable epileps    GI/Hepatic Neg liver ROS,,,Esophageal stricture   Endo/Other  Hypothyroidism    Renal/GU negative Renal ROS  negative genitourinary   Musculoskeletal negative musculoskeletal ROS (+)    Abdominal   Peds negative pediatric ROS (+)  Hematology negative hematology ROS (+)   Anesthesia Other Findings Rett syndrome Developmental delay  Reproductive/Obstetrics negative OB ROS                             Anesthesia Physical Anesthesia Plan  ASA: 3  Anesthesia Plan: General   Post-op Pain Management:    Induction: Intravenous  PONV Risk Score and Plan: 2 and Ondansetron, Dexamethasone and Treatment may vary due to age or medical condition  Airway Management Planned: Oral ETT  Additional Equipment:   Intra-op Plan:   Post-operative Plan:   Informed Consent: I have reviewed the patients History and Physical, chart, labs and discussed the procedure including the risks, benefits and alternatives for the proposed anesthesia with the patient or authorized representative who has indicated his/her understanding and acceptance.     Dental advisory given  Plan Discussed with: CRNA  Anesthesia Plan Comments:         Anesthesia Quick Evaluation

## 2023-10-01 ENCOUNTER — Inpatient Hospital Stay (HOSPITAL_COMMUNITY)

## 2023-10-01 DIAGNOSIS — K222 Esophageal obstruction: Secondary | ICD-10-CM

## 2023-10-01 DIAGNOSIS — G40309 Generalized idiopathic epilepsy and epileptic syndromes, not intractable, without status epilepticus: Secondary | ICD-10-CM | POA: Diagnosis not present

## 2023-10-01 DIAGNOSIS — F842 Rett's syndrome: Secondary | ICD-10-CM | POA: Diagnosis not present

## 2023-10-01 DIAGNOSIS — R1319 Other dysphagia: Secondary | ICD-10-CM | POA: Diagnosis not present

## 2023-10-01 MED ORDER — LEVOTHYROXINE SODIUM 50 MCG PO TABS
50.0000 ug | ORAL_TABLET | Freq: Every day | ORAL | Status: DC
  Administered 2023-10-02: 50 ug via ORAL
  Filled 2023-10-01: qty 1

## 2023-10-01 MED ORDER — LEVETIRACETAM 500 MG PO TABS
1000.0000 mg | ORAL_TABLET | Freq: Two times a day (BID) | ORAL | Status: DC
  Administered 2023-10-01 – 2023-10-02 (×2): 1000 mg via ORAL
  Filled 2023-10-01 (×2): qty 2

## 2023-10-01 MED ORDER — PHENYTOIN 50 MG PO CHEW
100.0000 mg | CHEWABLE_TABLET | Freq: Every day | ORAL | Status: DC
  Administered 2023-10-01 – 2023-10-02 (×2): 100 mg via ORAL
  Filled 2023-10-01 (×2): qty 2

## 2023-10-01 MED ORDER — TROFINETIDE 200 MG/ML PO SOLN
10000.0000 mg | Freq: Two times a day (BID) | ORAL | Status: DC
Start: 2023-10-01 — End: 2023-10-02

## 2023-10-01 MED ORDER — LEVETIRACETAM 250 MG PO TABS
250.0000 mg | ORAL_TABLET | Freq: Every day | ORAL | Status: DC
  Administered 2023-10-01: 250 mg via ORAL
  Filled 2023-10-01: qty 1

## 2023-10-01 MED ORDER — PANTOPRAZOLE SODIUM 40 MG IV SOLR
40.0000 mg | Freq: Two times a day (BID) | INTRAVENOUS | Status: DC
  Administered 2023-10-01 – 2023-10-02 (×3): 40 mg via INTRAVENOUS
  Filled 2023-10-01 (×3): qty 10

## 2023-10-01 NOTE — Evaluation (Signed)
 Clinical/Bedside Swallow Evaluation Patient Details  Name: Megan Walton MRN: [id number] Date of Birth: 10-23-1970  Today's Date: 10/01/2023 Time: SLP Start Time (ACUTE ONLY): 1200 SLP Stop Time (ACUTE ONLY): 1225 SLP Time Calculation (min) (ACUTE ONLY): 25 min  Past Medical History:  Past Medical History:  Diagnosis Date   Constipation, chronic    Developmental delay    Foot fracture    Herpes simplex type 1 antibody positive    Hypothyroidism    Mental retardation    Mental retardation    MR (mental retardation)    profound   Osteopenia    Right Hip   Rett syndrome    Rett's syndrome    Rett's syndrome    Seizures (HCC)    Thyroid disease    Past Surgical History: History reviewed. No pertinent surgical history. HPI:  Patient is a 53 y.o. female with PMH: Rett's syndrome who lives at a group home and is total care at baseline. She had an MBS on 10/05/22 through [facility] as an OP which reported functional oral and pharyngeal swallow with mild oral deficits and recommendation was for foods to be cut into bite sized pieces, thin liquids. She initially presented to the hospital on 09/27/23 with nausea and vomiting but was sent home. She returned to the hospital on 3/19 secondary to her not being able to tolerate any PO. CT scan of her abdomen showd dilated thoracic esophagus with a narrowing at the GE junction. (unknown stricture versus achalasia). GI followed and EGD performed on 3/21 which revealed a large volume of solid food matter in the esophagus, evidence of stenosis and esophagitis. Solid food matter was able to be transitioned through the GE junction, which resulted in dilation of the stricture as well. In addition, "multiple large pieces of meat, ?chicken, were removed."    Assessment / Plan / Recommendation  Clinical Impression  Patient presents with clinical s/s of dysphagia as per this bedside swallow evaluation, however she is likely at or near baseline swallow  function. Her mother reported that she is supposed to be getting a PO diet of solids which are cut into small pieces but she is able to have regular (thin) liquids. As EGD completed yesterday showed food impaction including large pieces of meat in her esophagus, it is suspected that she was given or had access to food that was not adequately cut up. SLP assessed patient's swallow with thin liquids, puree solids and approximately regular solids (goldfish crackers). Patient with timely swallow initiation, no overt s/s aspiration. She did exhibit mildly prolonged mastication with crackers but as she only has a few of her molars left, this is likely at or near baseline. SLP recommending continue with current PO diet (Dys 1, thin) for today and will f/u next date schedule permitting, with plan for advanced solids trials. SLP Visit Diagnosis: Dysphagia, unspecified (R13.10)    Aspiration Risk  Mild aspiration risk    Diet Recommendation Thin liquid;Dysphagia 1 (Puree)    Liquid Administration via: Cup;Straw Medication Administration: Other (Comment) (as tolerated) Supervision: Full supervision/cueing for compensatory strategies;Staff to assist with self feeding Compensations: Slow rate;Small sips/bites Postural Changes: Seated upright at 90 degrees;Remain upright for at least 30 minutes after po intake    Other  Recommendations Oral Care Recommendations: Oral care BID;Oral care before and after PO    Recommendations for follow up therapy are one component of a multi-disciplinary discharge planning process, led by the attending physician.  Recommendations may be updated based on  patient status, additional functional criteria and insurance authorization.  Follow up Recommendations No SLP follow up      Assistance Recommended at Discharge    Functional Status Assessment Patient has had a recent decline in their functional status and demonstrates the ability to make significant improvements in function  in a reasonable and predictable amount of time.  Frequency and Duration min 1 x/week  1 week       Prognosis Prognosis for improved oropharyngeal function: Good      Swallow Study   General Date of Onset: 09/28/23 HPI: Patient is a 53 y.o. female with PMH: Rett's syndrome who lives at a group home and is total care at baseline. She had an MBS on 10/05/22 through [facility] as an OP which reported functional oral and pharyngeal swallow with mild oral deficits and recommendation was for foods to be cut into bite sized pieces, thin liquids. She initially presented to the hospital on 09/27/23 with nausea and vomiting but was sent home. She returned to the hospital on 3/19 secondary to her not being able to tolerate any PO. CT scan of her abdomen showd dilated thoracic esophagus with a narrowing at the GE junction. (unknown stricture versus achalasia). GI followed and EGD performed on 3/21 which revealed a large volume of solid food matter in the esophagus, evidence of stenosis and esophagitis. Solid food matter was able to be transitioned through the GE junction, which resulted in dilation of the stricture as well. In addition, "multiple large pieces of meat, ?chicken, were removed." Type of Study: Bedside Swallow Evaluation Previous Swallow Assessment: MBS through [facility] March 2024 Diet Prior to this Study: Thin liquids (Level 0);Dysphagia 1 (pureed) Temperature Spikes Noted: Yes (99.4) Respiratory Status: Room air History of Recent Intubation: Yes Total duration of intubation (days): 1 days (for EGD) Date extubated: 09/30/23 Behavior/Cognition: Alert;Pleasant mood;Cooperative;Requires cueing Oral Cavity Assessment: Within Functional Limits Oral Care Completed by SLP: No Oral Cavity - Dentition: Missing dentition;Other (Comment) (has a few molars) Self-Feeding Abilities: Total assist Patient Positioning: Upright in bed Baseline Vocal Quality: Not observed Volitional Cough: Cognitively  unable to elicit Volitional Swallow: Unable to elicit    Oral/Motor/Sensory Function Overall Oral Motor/Sensory Function: Other (comment) (drooling of saliva which is baseline)   Ice Chips     Thin Liquid Thin Liquid: Within functional limits Presentation: Straw    Nectar Thick     Honey Thick     Puree Puree: Within functional limits Presentation: Spoon   Solid     Solid: Impaired Oral Phase Functional Implications: Other (comment) Pharyngeal Phase Impairments: Other (comments) Other Comments: mastication mildly delayed when eating goldfish crackers, but was complete and per family appeared to be at her baseline level     Angela Nevin, MA, CCC-SLP Speech Therapy

## 2023-10-01 NOTE — Progress Notes (Signed)
 PROGRESS NOTE    Megan Walton  ZOX:[id number] DOB: [birth year] DOA: 09/28/2023 PCP: Lucretia Field  Chief Complaint  Patient presents with   Emesis    Hospital Course:  Megan Walton is 53 y.o. 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  She was discharged back to the group home because she was otherwise stable.  She presented to the ED again on 3/19 due to inability to eat anything.  Group home staff reports she is vomiting everything she puts in.  She is having normal bowel movements. In the ED vitals and labs are grossly unremarkable.  Chest x-ray within normal limits.  CT abdomen pelvis consistent with dilated thoracic part of esophagus with possible food debris or stricture at the GE junction.  GI was consulted.  Patient was made n.p.o. and admitted for further workup. On 3/21 patient underwent EGD which demonstrated food in the entire esophagus as well as 1 benign-appearing intrinsic mild stenosis which was traversed and dilated.  No secondary achalasia was found.  She was also found to have LA grade D reflux esophagitis.  Subjective: No acute events overnight. On evaluation today patient is tolerated pureed foods well.  Objective: Vitals:   09/30/23 1715 09/30/23 2002 10/01/23 0422 10/01/23 0600  BP:  129/72 102/67 115/81  Pulse: 80 68 68 66  Resp: 13   18  Temp:  (!) 97.5 F (36.4 C) 98 F (36.7 C) 99.4 F (37.4 C)  TempSrc:  Oral Axillary Oral  SpO2: 100% (!) 72% 97% 98%  Weight:      Height:        Intake/Output Summary (Last 24 hours) at 10/01/2023 0836 Last data filed at 10/01/2023 0110 Gross per 24 hour  Intake 1631.21 ml  Output 800 ml  Net 831.21 ml   Filed Weights   09/28/23 2057 09/30/23 1550  Weight: 46.4 kg 45.1 kg    Examination: General exam: Appears calm and  comfortable, NAD  Respiratory system: No work of breathing, symmetric chest wall expansion, no wheeze, no rales Mouth: significant saliva production Cardiovascular system: S1 & S2 heard, RRR.  Gastrointestinal system: Abdomen is nondistended, soft and nontender.  Neuro: Alert, tracks.  Does not participate in evaluation.  Does not follow commands. 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  Food removed.  Grade D esophagitis - Continue with PPI twice daily for 30 days - On purees today, per shared decision making: goal will be minced or finely chopped foods. Pt mother reports prior weight loss with purees due to patient preference. To allow greatest quality of life we will aim for chopped foods. Mom is aware there is some risk of aspiration or another esophageal obstruction with this  - SLP today with prolonged mastication but otherwise without evidence of aspiration. Cont to advance diet as tolerated   Influenza A positive - Continue supportive care - She appears mostly asymptomatic from this standpoint. - No evidence of superimposed pneumonia at this time - Both mild leukocytosis. - Continue to  monitor vital signs.  Remains afebrile and stable. - N.p.o. on arrival for multiple days, held off on Tamiflu.  No further indication to start this now.  Generalized tonic-clonic seizure disorder -Now consistently tolerating p.o. will transition back to p.o. 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  Have discussed with mother who agrees for workup. -- Avoid TVUS if able, transabdominal ordered.  DVT prophylaxis: SCDs for now   Code Status: Full Code Family Communication:  Discussed directly with patient's mother and sister  Disposition: Inpatient.  Advancing diet slowly Eventual return to group home  Consultants:  Treatment Team:  Consulting Physician: Jeani Hawking, MD  Procedures:    Antimicrobials:  Anti-infectives (From admission, onward)    None       Data Reviewed: I have personally reviewed following labs and imaging studies CBC: Recent Labs  Lab 09/28/23 1610 09/29/23 0901 09/30/23 0926  WBC 11.9* 14.4* 11.7*  NEUTROABS 9.9* 10.8* 8.2*  HGB 15.5* 15.1* 12.9  HCT 46.5* 47.0* 39.5  MCV 101.3* 104.0* 102.3*  PLT 465* 321 282   Basic Metabolic Panel: Recent Labs  Lab 09/28/23 1610 09/29/23 0901 09/30/23 0926  NA 145 143 140  K 3.5 3.3* 4.0  CL 110 111 112*  CO2 20* 19* 21*  GLUCOSE 112* 95 89  BUN 14 11 7   CREATININE 0.60 0.49 0.43*  CALCIUM 9.5 8.7* 8.4*   GFR: Estimated Creatinine Clearance: 55.5 mL/min (A) (by C-G formula based on SCr of 0.43 mg/dL (L)). Liver Function Tests: Recent Labs  Lab 09/28/23 1610 09/29/23 0901 09/30/23 0926  AST 28 24 22   ALT 18 17 17   ALKPHOS 101 93 83  BILITOT 0.9 0.8 0.6  PROT 8.1 7.1 5.8*  ALBUMIN 4.2 3.7 2.9*   CBG: No results for input(s): "GLUCAP" in the last 168 hours.  Recent Results (from the past 240 hours)  Resp panel by RT-PCR (RSV, Flu A&B, Covid) Anterior Nasal Swab     Status: Abnormal   Collection Time: 09/28/23  3:58 PM   Specimen: Anterior Nasal Swab  Result Value Ref Range Status   SARS  Coronavirus 2 by RT PCR NEGATIVE NEGATIVE Final    Comment: (NOTE) SARS-CoV-2 target nucleic acids are NOT DETECTED.  The SARS-CoV-2 RNA is generally detectable in upper respiratory specimens during the acute phase of infection. The lowest concentration of SARS-CoV-2 viral copies this assay can detect is 138 copies/mL. A negative result does not preclude SARS-Cov-2 infection and should not be used as the sole basis for treatment or other patient management decisions. A negative result may occur with  improper specimen collection/handling, submission of specimen other than nasopharyngeal swab, presence of viral mutation(s) within the areas targeted by this assay, and inadequate number of viral copies(<138 copies/mL). A negative result must be combined with clinical observations, patient history, and epidemiological information. The expected result is Negative.  Fact Sheet for Patients:  BloggerCourse.com  Fact Sheet for Healthcare Providers:  SeriousBroker.it  This test is no t yet approved or cleared by the [place] FDA and  has been authorized for detection and/or diagnosis of SARS-CoV-2 by  FDA under an Emergency Use Authorization (EUA). This EUA will remain  in effect (meaning this test can be used) for the duration of the COVID-19 declaration under Section 564(b)(1) of the Act, 21 U.S.C.section 360bbb-3(b)(1), unless the authorization is terminated  or revoked sooner.       Influenza A by PCR POSITIVE (A) NEGATIVE Final   Influenza B by PCR NEGATIVE NEGATIVE Final    Comment: (NOTE) The Xpert Xpress SARS-CoV-2/FLU/RSV plus assay is intended as an aid in the diagnosis of influenza from Nasopharyngeal swab specimens and should not be used as a sole basis for treatment. Nasal washings and aspirates are unacceptable for Xpert Xpress SARS-CoV-2/FLU/RSV testing.  Fact Sheet for  Patients: BloggerCourse.com  Fact Sheet for Healthcare Providers: SeriousBroker.it  This test is not yet approved or cleared by the [place] FDA and has been authorized for detection and/or diagnosis of SARS-CoV-2 by FDA under an Emergency Use Authorization (EUA). This EUA will remain in effect (meaning this test can be used) for the duration of the COVID-19 declaration under Section 564(b)(1) of the Act, 21 U.S.C. section 360bbb-3(b)(1), unless the authorization is terminated or revoked.     Resp Syncytial Virus by PCR NEGATIVE NEGATIVE Final    Comment: (NOTE) Fact Sheet for Patients: BloggerCourse.com  Fact Sheet for Healthcare Providers: SeriousBroker.it  This test is not yet approved or cleared by the [place] FDA and has been authorized for detection and/or diagnosis of SARS-CoV-2 by FDA under an Emergency Use Authorization (EUA). This EUA will remain in effect (meaning this test can be used) for the duration of the COVID-19 declaration under Section 564(b)(1) of the Act, 21 U.S.C. section 360bbb-3(b)(1), unless the authorization is terminated or revoked.  Performed at [facility], [address], [place], [place] [address]      Radiology Studies: No results found.   Scheduled Meds:  carbamazepine  200 mg Oral BID   carbamazepine  300 mg Oral BID   pantoprazole  40 mg Oral BID AC   phenytoin (DILANTIN) IV  100 mg Intravenous Daily   Continuous Infusions:  levETIRAcetam 1,250 mg (09/30/23 1803)   levETIRAcetam Stopped (09/30/23 1012)     LOS: 2 days  MDM: Patient is high risk for one or more organ failure.  They necessitate ongoing hospitalization for continued IV therapies and subsequent lab monitoring. Total time spent interpreting labs and vitals, coordinating care amongst consultants and care team members, directly assessing and  discussing care with the patient and/or family: 55 min    Debarah Crape, DO [facility]  To contact the attending physician between 7A-7P please use Epic Chat. To contact the covering physician during after hours 7P-7A, please review Amion.   10/01/2023, 8:36 AM   *This document has been created with the assistance of dictation software. Please excuse typographical errors. *

## 2023-10-02 DIAGNOSIS — K222 Esophageal obstruction: Secondary | ICD-10-CM | POA: Diagnosis not present

## 2023-10-02 DIAGNOSIS — F842 Rett's syndrome: Secondary | ICD-10-CM | POA: Diagnosis not present

## 2023-10-02 DIAGNOSIS — G40919 Epilepsy, unspecified, intractable, without status epilepticus: Secondary | ICD-10-CM | POA: Diagnosis not present

## 2023-10-02 DIAGNOSIS — G40309 Generalized idiopathic epilepsy and epileptic syndromes, not intractable, without status epilepticus: Secondary | ICD-10-CM | POA: Diagnosis not present

## 2023-10-02 MED ORDER — PANTOPRAZOLE SODIUM 40 MG PO TBEC: 40.0000 mg | DELAYED_RELEASE_TABLET | Freq: Two times a day (BID) | ORAL | 0 refills | Status: AC

## 2023-10-02 MED ORDER — PANTOPRAZOLE SODIUM 40 MG PO TBEC: 40.0000 mg | DELAYED_RELEASE_TABLET | Freq: Two times a day (BID) | ORAL | Status: DC

## 2023-10-02 MED ORDER — ENSURE ENLIVE PO LIQD
237.0000 mL | Freq: Two times a day (BID) | ORAL | Status: DC
  Administered 2023-10-02: 237 mL via ORAL

## 2023-10-02 NOTE — Hospital Course (Signed)
 Megan Walton is 53 y.o. 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  She was discharged back to the group home because she was otherwise stable.  She presented to the ED again on 3/19 due to inability to eat anything.  Group home staff reports she is vomiting everything she puts in.  She is having normal bowel movements. In the ED vitals and labs are grossly unremarkable.  Chest x-ray within normal limits.  CT abdomen pelvis consistent with dilated thoracic part of esophagus with possible food debris or stricture at the GE junction.  GI was consulted.   On 3/21 patient underwent EGD which demonstrated food in the entire esophagus as well as 1 benign-appearing intrinsic mild stenosis which was traversed and dilated.  No secondary achalasia was found.  She was also found to have LA grade D reflux esophagitis.  She was started on pantoprazole twice daily which she will need to continue for 30 days and then proceed with once daily. Speech therapy was consulted to assess swallowing.  She was found to have a prolonged mastication but otherwise was without evidence of aspiration, swallow dysfunction.  Diet was slowly advanced and patient was found to easily tolerate chopped/ground foods without further issue. On evaluation 3/23 patient was back to her physiologic baseline.  I discussed her care extensively with her mother at bedside who agrees with plans to discharge with outpatient follow-up as outlined below.  I also discussed directly with her group home nurse, Tabatha, regarding the change in patient's diet and medical management while she was admitted.    Lower esophageal obstruction - EGD 3/21: Food in the entire esophagus, 1 benign-appearing stricture, dilated.  Food removed.  Grade D esophagitis - Continue with PPI twice daily for 30 days,  then once daily. - Patient appears to be tolerating minced/chopped/finely chopped foods.  No evidence of aspiration.  Speech therapy has evaluated her.  She does have prolonged mastication.  Important that she does not receive large chunks of food at home.  Have discussed this with her group home nurse.   Influenza A positive - Continue supportive care - She appears mostly asymptomatic from this standpoint. - Occasional cough and mucus production.  Patient has Mucinex available at group home already.  Continue as needed. 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 - Transvaginal ultrasound redemonstrates 2.4 cm mass but is unable to better characterize.  She would benefit from transvaginal ultrasound for better evaluation.  This workup is going to be completed outpatient.  I have discussed with her mother.  She needs to follow-up with primary care or gynecology to have this ordered.

## 2023-10-02 NOTE — Progress Notes (Signed)
 Speech Language Pathology Treatment: Dysphagia  Patient Details Name: LINCY BELLES MRN: [id number] DOB: 1970-08-30 Today's Date: 10/02/2023 Time: 0755-0820 SLP Time Calculation (min) (ACUTE ONLY): 25 min  Assessment / Plan / Recommendation Clinical Impression  Patient seen by SLP for skilled treatment focused on dysphagia goals. Patient was awake, alert, eyes opened and looking around. Her mother was in the room. Per mother, plan is for discharge back to group home likely in the next day or two pending results of ovary ultrasound. She would like for patient to start on advanced solids diet while admitted for close monitoring. SLP in agreement with this. SLP assessed her toleration with advanced solids of mechanical soft (graham cracker). Patient able to masticate using molars. She exhibits prolonged but complete mastication and bolus formation. In addition, she took some cup sips of thin liquids. Two instances of delayed, brief/single cough response observed but otherwise, patient appears to be tolerating dys 3 (mechanical soft) solids. SLP recommending to advance at this time and patient to continue to have close monitoring, full supervision with PO's. SLP will continue to follow and recommending HH SLP f/u at group home for dysphagia management.   HPI HPI: Patient is a 53 y.o. female with PMH: Rett's syndrome who lives at a group home and is total care at baseline. She had an MBS on 10/05/22 through [facility] as an OP which reported functional oral and pharyngeal swallow with mild oral deficits and recommendation was for foods to be cut into bite sized pieces, thin liquids. She initially presented to the hospital on 09/27/23 with nausea and vomiting but was sent home. She returned to the hospital on 3/19 secondary to her not being able to tolerate any PO. CT scan of her abdomen showd dilated thoracic esophagus with a narrowing at the GE junction. (unknown stricture versus achalasia). GI followed  and EGD performed on 3/21 which revealed a large volume of solid food matter in the esophagus, evidence of stenosis and esophagitis. Solid food matter was able to be transitioned through the GE junction, which resulted in dilation of the stricture as well. In addition, "multiple large pieces of meat, ?chicken, were removed."      SLP Plan  Continue with current plan of care      Recommendations for follow up therapy are one component of a multi-disciplinary discharge planning process, led by the attending physician.  Recommendations may be updated based on patient status, additional functional criteria and insurance authorization.    Recommendations  Diet recommendations: Dysphagia 3 (mechanical soft);Thin liquid Liquids provided via: Straw;Cup Medication Administration: Other (Comment) Supervision: Trained caregiver to feed patient;Staff to assist with self feeding Compensations: Slow rate;Small sips/bites Postural Changes and/or Swallow Maneuvers: Seated upright 90 degrees;Upright 30-60 min after meal                  Oral care BID;Oral care before and after PO   Frequent or constant Supervision/Assistance Dysphagia, unspecified (R13.10)     Continue with current plan of care     Angela Nevin, MA, CCC-SLP Speech Therapy

## 2023-10-02 NOTE — Anesthesia Postprocedure Evaluation (Signed)
 Anesthesia Post Note  Patient: Megan Walton  Procedure(s) Performed: EGD (ESOPHAGOGASTRODUODENOSCOPY) REMOVAL, FOREIGN BODY     Patient location during evaluation: PACU Anesthesia Type: General Level of consciousness: awake and alert Pain management: pain level controlled Vital Signs Assessment: post-procedure vital signs reviewed and stable Respiratory status: spontaneous breathing, nonlabored ventilation, respiratory function stable and patient connected to nasal cannula oxygen Cardiovascular status: blood pressure returned to baseline and stable Postop Assessment: no apparent nausea or vomiting Anesthetic complications: no   No notable events documented.  Last Vitals:  Vitals:   10/02/23 1030 10/02/23 1300  BP: 94/65 98/63  Pulse: 72 68  Resp: 16 16  Temp: 37.2 C 36.4 C  SpO2: 100% 99%    Last Pain:  Vitals:   10/02/23 1300  TempSrc: Axillary  PainSc: 0-No pain                 [place]

## 2023-10-02 NOTE — Discharge Summary (Signed)
 Physician Discharge Summary   Patient: Megan Walton MRN: [id number] DOB: [birth year]  Admit date:     09/28/2023  Discharge date: 10/02/23  Discharge Physician: Debarah Crape   PCP: Lucretia Field   Recommendations at discharge:   Follow-up with primary care physician or gynecology to order transvaginal ultrasound for better evaluation of ovarian cyst Moving forward do not consume large chunks of food, proceed with ground/chopped meats only.  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 *  Hospital Course: Megan Walton is 53 y.o. 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  She was discharged back to the group home because she was otherwise stable.  She presented to the ED again on 3/19 due to inability to eat anything.  Group home staff reports she is vomiting everything she puts in.  She is having normal bowel movements. In the ED vitals and labs are grossly unremarkable.  Chest x-ray within normal limits.  CT abdomen pelvis consistent with dilated thoracic part of esophagus with possible food debris or stricture at the GE junction.  GI was consulted.   On 3/21 patient underwent EGD which demonstrated food in the entire esophagus as well as 1 benign-appearing intrinsic mild stenosis which was traversed and dilated.  No secondary achalasia was found.  She was also found to have LA grade D reflux esophagitis.  She was started on pantoprazole twice daily which she will need to continue for 30 days and then proceed with once daily. Speech therapy was consulted to assess swallowing.  She was found to have  a prolonged mastication but otherwise was without evidence of aspiration, swallow dysfunction.  Diet was slowly advanced and patient was found to easily tolerate chopped/ground foods without further issue. On evaluation 3/23 patient was back to her physiologic baseline.  I discussed her care extensively with her mother at bedside who agrees with plans to discharge with outpatient follow-up as outlined below.  I also discussed directly with her group home nurse, Tabatha, regarding the change in patient's diet and medical management while she was admitted.    Lower esophageal obstruction - EGD 3/21: Food in the entire esophagus, 1 benign-appearing stricture, dilated.  Food removed.  Grade D esophagitis - Continue with PPI twice daily for 30 days, then once daily. - Patient appears to be tolerating minced/chopped/finely chopped foods.  No evidence of aspiration.  Speech therapy has evaluated her.  She does have prolonged mastication.  Important that she does not receive large chunks of food at home.  Have discussed this with her group home nurse.   Influenza A positive - Continue supportive care - She appears mostly asymptomatic from this standpoint. - Occasional cough and mucus production.  Patient has Mucinex available at group home already.  Continue as needed. 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 - Transvaginal ultrasound redemonstrates 2.4 cm mass but is unable to better characterize.  She would benefit from transvaginal ultrasound for better  evaluation.  This workup is going to be completed outpatient.  I have discussed with her mother.  She needs to follow-up with primary care or gynecology to have this ordered.  Rash - Erythematous papular rash mid sternum, does not appear to be getting any worse.  Nonbothersome to the patient - Likely contact dermatitis, Aquaphor or Vaseline as needed to keep the area from getting too dry  Consultants: Gastroenterology Procedures performed: EGD Disposition: Home Diet recommendation: Chopped/ground meats, thin liquids DISCHARGE MEDICATION: Allergies as of 10/02/2023       Reactions   Valproic Acid Other (See Comments)   Unknown. Reaction not listed on MAR    Cephalexin Other (See Comments)   Unknown. Reaction not listed on MAR    Clarithromycin Other (See Comments)   Unknown. Reaction not listed on MAR    Macrolides And Ketolides Other (See Comments)   Unknown. Reaction not listed on MAR    Penicillins Other (See Comments)   Unknown. Reaction not listed on MAR         Medication List     STOP taking these medications    acetaminophen 325 MG tablet Commonly known as: TYLENOL   clindamycin 150 MG capsule Commonly known as: CLEOCIN       TAKE these medications    acyclovir 400 MG tablet Commonly known as: ZOVIRAX Take 400 mg by mouth 2 (two) times daily.   ANTACID ANTI-GAS PO Take 15-30 mLs by mouth See admin instructions. Take 15 ml < 100 lbs 30 for >100 lbs by mouth every 2 hours as needed. Prn reason not listed   Benecalorie Liqd Take 44 mLs by mouth at bedtime.   bisacodyl 10 MG suppository Commonly known as: DULCOLAX Place 10 mg rectally daily as needed for moderate constipation or mild constipation (if constipation is not relieved by MOM).   CAL-GEST ANTACID PO Take 1 tablet by mouth in the morning.   carbamazepine 100 MG chewable tablet Commonly known as: TEGRETOL Chew 200-300 mg by mouth See admin instructions. Take 300 mg by mouth at 0800 and 2000.  Take 200 mg by mouth at 1100 and 1600.   Chest Congestion Relief 100 MG/5ML liquid Generic drug: guaiFENesin Take 300 mg by mouth every 4 (four) hours as needed for cough or to loosen phlegm.   Daybue 200 MG/ML Soln Generic drug: trofinetide Take 10,000 mg by mouth in the morning and at bedtime.   diphenhydrAMINE 25 MG tablet Commonly known as: BENADRYL Take 25 mg by mouth every 4 (four) hours as needed for allergies (nasal congestion).   FLEET ENEMA RE Place 1 Dose rectally daily as needed (constipation not resolved by dulcolax supp).   levETIRAcetam 250 MG tablet Commonly known as: KEPPRA Take 250 mg by mouth at bedtime.   levETIRAcetam 1000 MG tablet Commonly known as: KEPPRA Take 1,000 mg by mouth 2 (two) times daily.   levOCARNitine 330 MG tablet Commonly known as: CARNITOR Take 330 mg by mouth 2 (two) times daily.   levothyroxine 50 MCG tablet Commonly known as: SYNTHROID Take 50 mcg by mouth daily.   loperamide 2 MG tablet Commonly known as: IMODIUM A-D Take 2-4 mg by mouth as needed for diarrhea or loose stools. Take 4 mg by mouth for the initial first  dose. Then take 2 mg as needed for loose stools   megestrol 40 MG/ML suspension Commonly known as: MEGACE Take 400 mg by mouth daily.   Milk of Magnesia 1200 MG/15ML suspension Generic drug: magnesium hydroxide Take 15-30 mLs by mouth daily as needed (no BM in 3 days). 15 ml < 100 lbs 30 ml > 100 lbs   pantoprazole 40 MG tablet Commonly known as: PROTONIX Take 1 tablet (40 mg total) by mouth 2 (two) times daily. TWICE A DAY FOR 30 DAYS, THEN ONCE DAILY   phenytoin 50 MG tablet Commonly known as: DILANTIN Chew 100 mg by mouth daily.   promethazine 25 MG tablet Commonly known as: PHENERGAN Take 25 mg by mouth every 4 (four) hours as needed for nausea or vomiting.   VALTOCO 15 MG DOSE NA Place 15 mg into the nose as needed (seizures).   vitamin A & D ointment Apply 1 Application topically in the  morning, at noon, and at bedtime.   Vitamin D 50 MCG (2000 UT) Caps Take 4,000 Units by mouth daily.       Discharge Instructions     Call MD for:  difficulty breathing, headache or visual disturbances   Complete by: As directed    Call MD for:  persistant dizziness or light-headedness   Complete by: As directed    Call MD for:  persistant nausea and vomiting   Complete by: As directed    Call MD for:  severe uncontrolled pain   Complete by: As directed    Call MD for:  temperature >100.4   Complete by: As directed    Discharge instructions   Complete by: As directed    Ms. Yamile was admitted and found to have an esophageal obstruction due to a large chunk of food.  Please proceed with minced/chopped/ground meats moving forward.  She has been started on a new medication called pantoprazole.  She will need to take this twice daily for the next 30 days and then can take once daily.  She was found to have a rash in the center of her chest, this does not appear to be getting worse.  Please apply regular Vaseline or Aquaphor to this lesion to keep it from drying out  She was also incidentally found to have influenza.  She has been mostly asymptomatic from this standpoint.  She should continue to take Mucinex as needed for symptoms.  Additionally she was found to have a 2cm mass on her ovary, she will need to follow-up with primary care or gynecology to schedule an outpatient transvaginal ultrasound for further evaluation.   Increase activity slowly   Complete by: As directed         Discharge Exam: Filed Weights   09/28/23 2057 09/30/23 1550  Weight: 46.4 kg 45.1 kg   General exam: Appears calm and comfortable, NAD  Respiratory system: No work of breathing, symmetric chest wall expansion, no wheeze, no rales Cardiovascular system: S1 & S2 heard, RRR.  Gastrointestinal system: Abdomen is nondistended, soft and nontender.  Neuro: Alert, tracks.  Does not participate in  evaluation.  Does not follow commands. Skin: erythematous papular blanching rash midsternum, no surrouding excoriations  Condition at discharge: stable  The results of significant diagnostics from this hospitalization (including imaging, microbiology, ancillary and laboratory) are listed below for reference.   Imaging Studies: US PELVIS (TRANSABDOMINAL ONLY) Result Date: 10/01/2023 CLINICAL DATA:  [id number] Ovarian mass [id number] EXAM: TRANSABDOMINAL ULTRASOUND OF PELVIS TECHNIQUE: Transabdominal ultrasound examination of  the pelvis was performed including evaluation of the uterus, ovaries, adnexal regions, and pelvic cul-de-sac. COMPARISON:  CT abdomen pelvis 09/28/2023 FINDINGS: Uterus Measurements: 5.1 x 2 x 3.8 cm = volume: 20.4 mL. No fibroids or other mass visualized. Endometrium Thickness: 3mm.  No focal abnormality visualized. Right ovary Not visualized. Left ovary Measurements: 2.8 x 2.3 x 1.8 cm = volume: 3.2 mL. Heterogeneous at least 2.4 cm left ovary versus adnexal mass poorly visualized. Other findings:  No abnormal free fluid. IMPRESSION: Heterogeneous at least 2.4 cm left ovary versus adnexal mass poorly visualized on transabdominal only ultrasound. Transvaginal ultrasound not obtained given patient altered mental status. Electronically Signed   By: Tish Frederickson M.D.   On: 10/01/2023 22:42   CT ABDOMEN PELVIS W CONTRAST Result Date: 09/28/2023 CLINICAL DATA:  Acute abdominal pain and vomiting beginning yesterday. EXAM: CT ABDOMEN AND PELVIS WITH CONTRAST TECHNIQUE: Multidetector CT imaging of the abdomen and pelvis was performed using the standard protocol following bolus administration of intravenous contrast. RADIATION DOSE REDUCTION: This exam was performed according to the departmental dose-optimization program which includes automated exposure control, adjustment of the mA and/or kV according to patient size and/or use of iterative reconstruction technique. CONTRAST:  80mL OMNIPAQUE  IOHEXOL 300 MG/ML  SOLN COMPARISON:  None Available. FINDINGS: Lower Chest: Dilated thoracic esophagus is seen containing fluid and food stuff, with transition point at the GE junction. This could be due to esophageal stricture or achalasia. Lung bases are clear. Hepatobiliary: No suspicious hepatic masses identified. Gallbladder is unremarkable. No evidence of biliary ductal dilatation. Pancreas:  No mass or inflammatory changes. Spleen: Within normal limits in size and appearance. Adrenals/Urinary Tract: No suspicious masses identified. No evidence of ureteral calculi or hydronephrosis. Unremarkable unopacified urinary bladder. Stomach/Bowel: No evidence of obstruction, inflammatory process or abnormal fluid collections. Normal appendix visualized. Vascular/Lymphatic: No pathologically enlarged lymph nodes. No acute vascular findings. Reproductive: 1 cm fibroid seen in the anterior uterine corpus. Heterogeneous low-attenuation lesion is seen in the left adnexa which measures 4 x 3 cm and has a somewhat serpiginous appearance best seen on coronal images. Differential diagnosis includes hydro-pyo-salpinx, endometrioma, and ovarian neoplasm. No evidence of peritoneal thickening or ascites. Other:  None. Musculoskeletal:  No suspicious bone lesions identified. IMPRESSION: Dilated thoracic esophagus containing fluid and food stuff, with transition point at the GE junction. This could be due to esophageal stricture or achalasia. 4 cm heterogeneous low-attenuation lesion in left adnexa. Differential diagnosis includes hydro-pyo-salpinx, endometrioma, and ovarian neoplasm. Recommend pelvic ultrasound for further characterization. Small uterine fibroid. Electronically Signed   By: Danae Orleans M.D.   On: 09/28/2023 18:11   DG Chest Portable 1 View Result Date: 09/28/2023 CLINICAL DATA:  Concern for aspiration pneumonia. EXAM: PORTABLE CHEST 1 VIEW COMPARISON:  Chest radiograph dated 09/27/2023 FINDINGS: No focal  consolidation, pleural effusion, pneumothorax. The cardiac silhouette is within normal limits. No acute osseous pathology. IMPRESSION: No active disease. Electronically Signed   By: Elgie Collard M.D.   On: 09/28/2023 16:46   DG Chest 2 View Result Date: 09/27/2023 CLINICAL DATA:  Cough and vomiting. EXAM: CHEST - 2 VIEW COMPARISON:  Chest radiograph 10/08/2022 FINDINGS: The cardiomediastinal contours are normal. The lungs are clear. Pulmonary vasculature is normal. No consolidation, pleural effusion, or pneumothorax. No acute osseous abnormalities are seen. IMPRESSION: No active cardiopulmonary disease. Electronically Signed   By: Narda Rutherford M.D.   On: 09/27/2023 21:07    Microbiology: Results for orders placed or performed during the hospital encounter of 09/28/23  Resp panel by RT-PCR (RSV, Flu A&B, Covid) Anterior Nasal Swab     Status: Abnormal   Collection Time: 09/28/23  3:58 PM   Specimen: Anterior Nasal Swab  Result Value Ref Range Status   SARS Coronavirus 2 by RT PCR NEGATIVE NEGATIVE Final    Comment: (NOTE) SARS-CoV-2 target nucleic acids are NOT DETECTED.  The SARS-CoV-2 RNA is generally detectable in upper respiratory specimens during the acute phase of infection. The lowest concentration of SARS-CoV-2 viral copies this assay can detect is 138 copies/mL. A negative result does not preclude SARS-Cov-2 infection and should not be used as the sole basis for treatment or other patient management decisions. A negative result may occur with  improper specimen collection/handling, submission of specimen other than nasopharyngeal swab, presence of viral mutation(s) within the areas targeted by this assay, and inadequate number of viral copies(<138 copies/mL). A negative result must be combined with clinical observations, patient history, and epidemiological information. The expected result is Negative.  Fact Sheet for Patients:   BloggerCourse.com  Fact Sheet for Healthcare Providers:  SeriousBroker.it  This test is no t yet approved or cleared by the [place] FDA and  has been authorized for detection and/or diagnosis of SARS-CoV-2 by FDA under an Emergency Use Authorization (EUA). This EUA will remain  in effect (meaning this test can be used) for the duration of the COVID-19 declaration under Section 564(b)(1) of the Act, 21 U.S.C.section 360bbb-3(b)(1), unless the authorization is terminated  or revoked sooner.       Influenza A by PCR POSITIVE (A) NEGATIVE Final   Influenza B by PCR NEGATIVE NEGATIVE Final    Comment: (NOTE) The Xpert Xpress SARS-CoV-2/FLU/RSV plus assay is intended as an aid in the diagnosis of influenza from Nasopharyngeal swab specimens and should not be used as a sole basis for treatment. Nasal washings and aspirates are unacceptable for Xpert Xpress SARS-CoV-2/FLU/RSV testing.  Fact Sheet for Patients: BloggerCourse.com  Fact Sheet for Healthcare Providers: SeriousBroker.it  This test is not yet approved or cleared by the [place] FDA and has been authorized for detection and/or diagnosis of SARS-CoV-2 by FDA under an Emergency Use Authorization (EUA). This EUA will remain in effect (meaning this test can be used) for the duration of the COVID-19 declaration under Section 564(b)(1) of the Act, 21 U.S.C. section 360bbb-3(b)(1), unless the authorization is terminated or revoked.     Resp Syncytial Virus by PCR NEGATIVE NEGATIVE Final    Comment: (NOTE) Fact Sheet for Patients: BloggerCourse.com  Fact Sheet for Healthcare Providers: SeriousBroker.it  This test is not yet approved or cleared by the [place] FDA and has been authorized for detection and/or diagnosis of SARS-CoV-2 by FDA under an Emergency Use  Authorization (EUA). This EUA will remain in effect (meaning this test can be used) for the duration of the COVID-19 declaration under Section 564(b)(1) of the Act, 21 U.S.C. section 360bbb-3(b)(1), unless the authorization is terminated or revoked.  Performed at [facility], [address], [place], [place] [address]     Labs: CBC: Recent Labs  Lab 09/28/23 1610 09/29/23 0901 09/30/23 0926  WBC 11.9* 14.4* 11.7*  NEUTROABS 9.9* 10.8* 8.2*  HGB 15.5* 15.1* 12.9  HCT 46.5* 47.0* 39.5  MCV 101.3* 104.0* 102.3*  PLT 465* 321 282   Basic Metabolic Panel: Recent Labs  Lab 09/28/23 1610 09/29/23 0901 09/30/23 0926  NA 145 143 140  K 3.5 3.3* 4.0  CL 110 111 112*  CO2 20* 19*  21*  GLUCOSE 112* 95 89  BUN 14 11 7   CREATININE 0.60 0.49 0.43*  CALCIUM 9.5 8.7* 8.4*   Liver Function Tests: Recent Labs  Lab 09/28/23 1610 09/29/23 0901 09/30/23 0926  AST 28 24 22   ALT 18 17 17   ALKPHOS 101 93 83  BILITOT 0.9 0.8 0.6  PROT 8.1 7.1 5.8*  ALBUMIN 4.2 3.7 2.9*   CBG: No results for input(s): "GLUCAP" in the last 168 hours.  Discharge time spent: 31 minutes.  Signed: Debarah Crape, DO [facility] 10/02/2023

## 2023-10-02 NOTE — Plan of Care (Signed)
  Problem: Clinical Measurements: °Goal: Ability to maintain clinical measurements within normal limits will improve °Outcome: Progressing °  °Problem: Education: °Goal: Knowledge of General Education information will improve °Description: Including pain rating scale, medication(s)/side effects and non-pharmacologic comfort measures °Outcome: Not Progressing °  °Problem: Health Behavior/Discharge Planning: °Goal: Ability to manage health-related needs will improve °Outcome: Not Progressing °  °

## 2023-10-03 ENCOUNTER — Encounter (HOSPITAL_COMMUNITY): Payer: Self-pay | Admitting: Gastroenterology

## 2023-10-31 ENCOUNTER — Inpatient Hospital Stay (HOSPITAL_COMMUNITY)

## 2023-10-31 ENCOUNTER — Emergency Department (HOSPITAL_COMMUNITY)

## 2023-10-31 ENCOUNTER — Encounter (HOSPITAL_COMMUNITY): Payer: Self-pay

## 2023-10-31 ENCOUNTER — Observation Stay (HOSPITAL_COMMUNITY)
Admission: EM | Admit: 2023-10-31 | Discharge: 2023-11-01 | Disposition: A | Discharge status: Home / Self Care | Attending: Student | Admitting: Student

## 2023-10-31 ENCOUNTER — Other Ambulatory Visit: Payer: Self-pay

## 2023-10-31 DIAGNOSIS — R531 Weakness: Secondary | ICD-10-CM | POA: Diagnosis not present

## 2023-10-31 DIAGNOSIS — N838 Other noninflammatory disorders of ovary, fallopian tube and broad ligament: Secondary | ICD-10-CM | POA: Diagnosis not present

## 2023-10-31 DIAGNOSIS — F842 Rett's syndrome: Secondary | ICD-10-CM | POA: Insufficient documentation

## 2023-10-31 DIAGNOSIS — Z79899 Other long term (current) drug therapy: Secondary | ICD-10-CM | POA: Diagnosis not present

## 2023-10-31 DIAGNOSIS — E538 Deficiency of other specified B group vitamins: Secondary | ICD-10-CM | POA: Diagnosis not present

## 2023-10-31 DIAGNOSIS — I639 Cerebral infarction, unspecified: Secondary | ICD-10-CM | POA: Diagnosis not present

## 2023-10-31 DIAGNOSIS — G459 Transient cerebral ischemic attack, unspecified: Secondary | ICD-10-CM | POA: Diagnosis not present

## 2023-10-31 DIAGNOSIS — D72829 Elevated white blood cell count, unspecified: Secondary | ICD-10-CM | POA: Insufficient documentation

## 2023-10-31 DIAGNOSIS — R2681 Unsteadiness on feet: Secondary | ICD-10-CM | POA: Diagnosis not present

## 2023-10-31 DIAGNOSIS — G40909 Epilepsy, unspecified, not intractable, without status epilepticus: Secondary | ICD-10-CM | POA: Insufficient documentation

## 2023-10-31 DIAGNOSIS — E039 Hypothyroidism, unspecified: Secondary | ICD-10-CM | POA: Insufficient documentation

## 2023-10-31 LAB — COMPREHENSIVE METABOLIC PANEL WITH GFR
ALT: 17 U/L (ref 0–44)
AST: 24 U/L (ref 15–41)
Albumin: 3.8 g/dL (ref 3.5–5.0)
Alkaline Phosphatase: 85 U/L (ref 38–126)
Anion gap: 9 (ref 5–15)
BUN: 7 mg/dL (ref 6–20)
CO2: 24 mmol/L (ref 22–32)
Calcium: 9.1 mg/dL (ref 8.9–10.3)
Chloride: 107 mmol/L (ref 98–111)
Creatinine, Ser: 0.63 mg/dL (ref 0.44–1.00)
GFR, Estimated: >60 mL/min (ref >60)
Glucose, Bld: 127 mg/dL — ABNORMAL HIGH (ref 70–99)
Potassium: 4 mmol/L (ref 3.5–5.1)
Sodium: 140 mmol/L (ref 135–145)
Total Bilirubin: 0.7 mg/dL (ref 0.0–1.2)
Total Protein: 6.6 g/dL (ref 6.5–8.1)

## 2023-10-31 LAB — DIFFERENTIAL
Abs Immature Granulocytes: 0.05 10*3/uL (ref 0.00–0.07)
Basophils Absolute: 0 10*3/uL (ref 0.0–0.1)
Basophils Relative: 0 %
Eosinophils Absolute: 0.1 10*3/uL (ref 0.0–0.5)
Eosinophils Relative: 0 %
Immature Granulocytes: 0 %
Lymphocytes Relative: 12 %
Lymphs Abs: 2 10*3/uL (ref 0.7–4.0)
Monocytes Absolute: 1.1 10*3/uL — ABNORMAL HIGH (ref 0.1–1.0)
Monocytes Relative: 6 %
Neutro Abs: 13.4 10*3/uL — ABNORMAL HIGH (ref 1.7–7.7)
Neutrophils Relative %: 82 %

## 2023-10-31 LAB — I-STAT CHEM 8, ED
BUN: 7 mg/dL (ref 6–20)
Calcium, Ion: 1.18 mmol/L (ref 1.15–1.40)
Chloride: 106 mmol/L (ref 98–111)
Creatinine, Ser: 0.6 mg/dL (ref 0.44–1.00)
Glucose, Bld: 122 mg/dL — ABNORMAL HIGH (ref 70–99)
HCT: 44 % (ref 36.0–46.0)
Hemoglobin: 15 g/dL (ref 12.0–15.0)
Potassium: 3.9 mmol/L (ref 3.5–5.1)
Sodium: 141 mmol/L (ref 135–145)
TCO2: 25 mmol/L (ref 22–32)

## 2023-10-31 LAB — CBC
HCT: 41.9 % (ref 36.0–46.0)
Hemoglobin: 14.2 g/dL (ref 12.0–15.0)
MCH: 34.4 pg — ABNORMAL HIGH (ref 26.0–34.0)
MCHC: 33.9 g/dL (ref 30.0–36.0)
MCV: 101.5 fL — ABNORMAL HIGH (ref 80.0–100.0)
Platelets: 271 10*3/uL (ref 150–400)
RBC: 4.13 MIL/uL (ref 3.87–5.11)
RDW: 12.7 % (ref 11.5–15.5)
WBC: 16.6 10*3/uL — ABNORMAL HIGH (ref 4.0–10.5)
nRBC: 0 % (ref 0.0–0.2)

## 2023-10-31 LAB — APTT: aPTT: 24 s (ref 24–36)

## 2023-10-31 LAB — PROTIME-INR
INR: 1 (ref 0.8–1.2)
Prothrombin Time: 13.2 s (ref 11.4–15.2)

## 2023-10-31 LAB — ETHANOL: Alcohol, Ethyl (B): <10 mg/dL (ref <10)

## 2023-10-31 LAB — CBG MONITORING, ED: Glucose-Capillary: 128 mg/dL — ABNORMAL HIGH (ref 70–99)

## 2023-10-31 MED ORDER — DEXTROSE-SODIUM CHLORIDE 5-0.9 % IV SOLN: INTRAVENOUS | Status: DC

## 2023-10-31 MED ORDER — MEGESTROL ACETATE 400 MG/10ML PO SUSP
400.0000 mg | Freq: Every day | ORAL | Status: DC
Start: 2023-10-31 — End: 2023-11-01
  Filled 2023-10-31 (×2): qty 10

## 2023-10-31 MED ORDER — LEVETIRACETAM 250 MG PO TABS
250.0000 mg | ORAL_TABLET | Freq: Every day | ORAL | Status: DC
Start: 2023-10-31 — End: 2023-11-01
  Administered 2023-10-31: 250 mg via ORAL
  Filled 2023-10-31: qty 1

## 2023-10-31 MED ORDER — DIPHENHYDRAMINE HCL 25 MG PO CAPS: 25.0000 mg | ORAL_CAPSULE | ORAL | Status: DC | PRN

## 2023-10-31 MED ORDER — PHENYTOIN 50 MG PO CHEW
100.0000 mg | CHEWABLE_TABLET | Freq: Every day | ORAL | Status: DC
  Administered 2023-11-01: 100 mg via ORAL
  Filled 2023-10-31: qty 2

## 2023-10-31 MED ORDER — DIPHENHYDRAMINE HCL 25 MG PO TABS
25.0000 mg | ORAL_TABLET | ORAL | Status: DC | PRN
Start: 2023-10-31 — End: 2023-10-31

## 2023-10-31 MED ORDER — ACYCLOVIR 400 MG PO TABS
400.0000 mg | ORAL_TABLET | Freq: Two times a day (BID) | ORAL | Status: DC
  Administered 2023-10-31 – 2023-11-01 (×2): 400 mg via ORAL
  Filled 2023-10-31 (×3): qty 1

## 2023-10-31 MED ORDER — IOHEXOL 350 MG/ML SOLN
75.0000 mL | Freq: Once | INTRAVENOUS | Status: AC | PRN
  Administered 2023-10-31: 75 mL via INTRAVENOUS

## 2023-10-31 MED ORDER — CARBAMAZEPINE 100 MG PO CHEW
200.0000 mg | CHEWABLE_TABLET | ORAL | Status: DC
Start: 2023-10-31 — End: 2023-10-31

## 2023-10-31 MED ORDER — ENSURE ENLIVE PO LIQD
237.0000 mL | Freq: Every day | ORAL | Status: DC
  Filled 2023-10-31: qty 237

## 2023-10-31 MED ORDER — LEVOTHYROXINE SODIUM 25 MCG PO TABS
50.0000 ug | ORAL_TABLET | Freq: Every day | ORAL | Status: DC
  Administered 2023-11-01: 50 ug via ORAL
  Filled 2023-10-31: qty 2

## 2023-10-31 MED ORDER — LEVETIRACETAM 500 MG PO TABS
1000.0000 mg | ORAL_TABLET | Freq: Two times a day (BID) | ORAL | Status: DC
  Administered 2023-10-31 – 2023-11-01 (×2): 1000 mg via ORAL
  Filled 2023-10-31 (×2): qty 2

## 2023-10-31 MED ORDER — STROKE: EARLY STAGES OF RECOVERY BOOK: Freq: Once | Status: DC

## 2023-10-31 MED ORDER — PANTOPRAZOLE SODIUM 40 MG PO TBEC
40.0000 mg | DELAYED_RELEASE_TABLET | Freq: Every day | ORAL | Status: DC
  Administered 2023-11-01: 40 mg via ORAL
  Filled 2023-10-31: qty 1

## 2023-10-31 MED ORDER — DIAZEPAM (15 MG DOSE) 2 X 7.5 MG/0.1ML NA LQPK: 15.0000 mg | NASAL | Status: DC | PRN

## 2023-10-31 MED ORDER — CARBAMAZEPINE 100 MG PO CHEW
200.0000 mg | CHEWABLE_TABLET | Freq: Two times a day (BID) | ORAL | Status: DC
  Administered 2023-11-01: 200 mg via ORAL
  Filled 2023-10-31 (×2): qty 2

## 2023-10-31 MED ORDER — SODIUM CHLORIDE 0.9% FLUSH
3.0000 mL | Freq: Once | INTRAVENOUS | Status: AC
  Administered 2023-10-31: 3 mL via INTRAVENOUS

## 2023-10-31 MED ORDER — CARBAMAZEPINE 100 MG PO CHEW
300.0000 mg | CHEWABLE_TABLET | Freq: Two times a day (BID) | ORAL | Status: DC
  Administered 2023-11-01: 300 mg via ORAL
  Filled 2023-10-31 (×3): qty 3

## 2023-10-31 MED ORDER — LORAZEPAM 2 MG/ML IJ SOLN
1.0000 mg | Freq: Once | INTRAMUSCULAR | Status: AC
  Administered 2023-10-31: 1 mg via INTRAVENOUS
  Filled 2023-10-31: qty 1

## 2023-10-31 MED ORDER — HEPARIN SODIUM (PORCINE) 5000 UNIT/ML IJ SOLN
5000.0000 [IU] | Freq: Two times a day (BID) | INTRAMUSCULAR | Status: DC
  Administered 2023-10-31 – 2023-11-01 (×2): 5000 [IU] via SUBCUTANEOUS
  Filled 2023-10-31 (×2): qty 1

## 2023-10-31 MED ORDER — TROFINETIDE 200 MG/ML PO SOLN
10000.0000 mg | Freq: Two times a day (BID) | ORAL | Status: DC
Start: 2023-10-31 — End: 2023-11-01

## 2023-10-31 NOTE — ED Notes (Signed)
 Patient transported to Ultrasound with mom

## 2023-10-31 NOTE — ED Notes (Signed)
 Pt has passed her swallow screen.PT drinks w/o a straw.

## 2023-10-31 NOTE — Progress Notes (Signed)
 Attempted patient twice, even with medication patient was moving too much for exam.

## 2023-10-31 NOTE — Code Documentation (Signed)
 Stroke Response Nurse Documentation Code Documentation  Megan Walton is a 53 y.o. female arriving to [facility]  via [facility] on 10/31/2023 with past medical hx of thyroid disease, seizures, developmental delay, and Rett's syndrome. On No antithrombotic. Code stroke was activated by EMS.   Patient from group home where she was LKW at 1200 and noted to have a left facial droop with arm weakness at 1400. Nonverbal and walks with assistance baseline.  Stroke team at the bedside on patient arrival. Labs drawn and patient cleared for CT by EDP. Patient to CT with team. NIHSS 12, see documentation for details and code stroke times. Patient with disoriented, not following commands, left facial droop, bilateral leg weakness, Global aphasia , and dysarthria  on exam. The following imaging was completed:  CT Head and CTA. Patient is not a candidate for IV Thrombolytic due to too mild to treat. Patient is not a candidate for IR due to no LVO.   Care Plan: Q30 min VS and NIHSS until out 1630.   Bedside handoff with ED RN Jyl Or.    Felicitas Horse Stroke Response RN

## 2023-10-31 NOTE — ED Notes (Signed)
 Patient transported to MRI

## 2023-10-31 NOTE — H&P (Addendum)
 History and Physical    Patient: Megan Walton:[id number] DOB: Oct 06, 1970 DOA: 10/31/2023 DOS: the patient was seen and examined on 10/31/2023 PCP: Eduardo Grade  Patient coming from:  Group home Chief complaint: Chief Complaint  Patient presents with   Code Stroke   HPI:  Megan Walton is a 53 y.o. female with past medical history  of Rett syndrome, seizure disorder, nonverbal but ambulatory with help at baseline, developmental delay and hypothyroidism presenting to the emergency department as a code stroke for left-sided weakness and left facial droop noted at the group home.  Patient is nonverbal and therefore HPI is limited.  Mom at bedside states that she was doing well a day ago and eating and drinking.  She was recently discharged from [facility] a few weeks ago after being admitted for an esophageal blockage.  ED Course: Pt in ed at bedside  is awake cooperative nonverbal.  O2 sats of 83% noted.   Vital signs in the ED were notable for the following: Weight of 48.8 kg BMI of 21.73. Vitals:   10/31/23 1515 10/31/23 1530 10/31/23 1545 10/31/23 1600  BP: 113/83 108/76 109/72 103/77  Pulse: 72 69 72 76  Temp:      Resp: 12 17 19 19   Weight:      SpO2: 100% 100% 100% 92%  TempSrc:      >>ED evaluation thus far shows: Initial EKG today sinus rhythm 72 PR 174 QTc of 421 low voltage throughout. CT showed lacunar infarcts which are new from the previous CT head and neurology is consulted patient is not a TNK candidate and recommended admission for code stroke workup. Blood work today showed normal CMP with a glucose of 127. CBC shows a white count of 16.6 normal hemoglobin MCV 101.5 normal platelets. Chest x-ray ordered and pending. MRI ordered and pending.  >>While in the ED patient received the following: Medications  sodium chloride  flush (NS) 0.9 % injection 3 mL (has no administration in time range)  LORazepam  (ATIVAN ) injection 1 mg (has no administration in time  range)   stroke: early stages of recovery book (has no administration in time range)  iohexol  (OMNIPAQUE ) 350 MG/ML injection 75 mL (75 mLs Intravenous Contrast Given 10/31/23 1458)   Review of Systems  Unable to perform ROS: Patient nonverbal   Past Medical History:  Diagnosis Date   Constipation, chronic    Developmental delay    Foot fracture    Herpes simplex type 1 antibody positive    Hypothyroidism    Mental retardation    Mental retardation    MR (mental retardation)    profound   Osteopenia    Right Hip   Rett syndrome    Rett's syndrome    Rett's syndrome    Seizures (HCC)    Thyroid disease    Past Surgical History:  Procedure Laterality Date   ESOPHAGOGASTRODUODENOSCOPY N/A 09/30/2023   Procedure: EGD (ESOPHAGOGASTRODUODENOSCOPY);  Surgeon: Alvis Jourdain, MD;  Location: [facility];  Service: Gastroenterology;  Laterality: N/A;   FOREIGN BODY REMOVAL  09/30/2023   Procedure: REMOVAL, FOREIGN BODY;  Surgeon: Alvis Jourdain, MD;  Location: [facility];  Service: Gastroenterology;;    reports that she has never smoked. She has never used smokeless tobacco. She reports that she does not drink alcohol and does not use drugs. Allergies  Allergen Reactions   Valproic Acid  Other (See Comments)    Unknown. Reaction not listed on MAR    Cephalexin Other (See Comments)  Unknown. Reaction not listed on MAR    Clarithromycin Other (See Comments)    Unknown. Reaction not listed on MAR    Macrolides And Ketolides Other (See Comments)    Unknown. Reaction not listed on MAR    Penicillins Other (See Comments)    Unknown. Reaction not listed on MAR    Family History  Problem Relation Age of Onset   Lymphoma Father    Healthy Mother    Healthy Sister    ADD / ADHD Brother    Healthy Brother    Prior to Admission medications   Medication Sig Start Date End Date Taking? Authorizing Provider  acyclovir  (ZOVIRAX ) 400 MG tablet Take 400 mg by mouth 2 (two) times daily.     [provider]  Alum & Mag Hydroxide-Simeth (ANTACID ANTI-GAS PO) Take 15-30 mLs by mouth See admin instructions. Take 15 ml < 100 lbs 30 for >100 lbs by mouth every 2 hours as needed. Prn reason not listed    [provider]  bisacodyl (DULCOLAX) 10 MG suppository Place 10 mg rectally daily as needed for moderate constipation or mild constipation (if constipation is not relieved by MOM).    [provider]  Calcium Carbonate Antacid (CAL-GEST ANTACID PO) Take 1 tablet by mouth in the morning.    [provider]  carbamazepine  (TEGRETOL ) 100 MG chewable tablet Chew 200-300 mg by mouth See admin instructions. Take 300 mg by mouth at 0800 and 2000. Take 200 mg by mouth at 1100 and 1600.    [provider]  Cholecalciferol (VITAMIN D) 50 MCG (2000 UT) CAPS Take 4,000 Units by mouth daily.    [provider]  diazePAM  (VALTOCO  15 MG DOSE NA) Place 15 mg into the nose as needed (seizures).    [provider]  diphenhydrAMINE  (BENADRYL ) 25 MG tablet Take 25 mg by mouth every 4 (four) hours as needed for allergies (nasal congestion).    [provider]  guaiFENesin (CHEST CONGESTION RELIEF) 100 MG/5ML liquid Take 300 mg by mouth every 4 (four) hours as needed for cough or to loosen phlegm.    [provider]  levETIRAcetam  (KEPPRA ) 1000 MG tablet Take 1,000 mg by mouth 2 (two) times daily.    [provider]  levETIRAcetam  (KEPPRA ) 250 MG tablet Take 250 mg by mouth at bedtime.    [provider]  levOCARNitine (CARNITOR) 330 MG tablet Take 330 mg by mouth 2 (two) times daily.    [provider]  levothyroxine  (SYNTHROID , LEVOTHROID) 50 MCG tablet Take 50 mcg by mouth daily.    [provider]  loperamide (IMODIUM A-D) 2 MG tablet Take 2-4 mg by mouth as needed for diarrhea or loose stools. Take 4 mg by mouth for the initial first dose. Then take 2 mg as needed for loose stools    [provider]  magnesium hydroxide (MILK OF MAGNESIA) 400 MG/5ML suspension Take 15-30 mLs by mouth daily as needed (no BM in 3 days). 15 ml < 100 lbs 30 ml > 100 lbs    [provider]  megestrol  (MEGACE ) 40 MG/ML suspension Take 400 mg by mouth daily.    [provider]  Nutritional Supplements (BENECALORIE) LIQD Take 44 mLs by mouth at bedtime.    [provider]  pantoprazole  (PROTONIX ) 40 MG tablet Take 1 tablet (40 mg total) by mouth 2 (two) times daily. TWICE A DAY FOR 30 DAYS, THEN ONCE DAILY 10/02/23   Dezii, Alexandra,  DO  phenytoin  (DILANTIN ) 50 MG tablet Chew 100 mg by mouth daily.    [provider]  promethazine (PHENERGAN) 25 MG tablet Take 25 mg by mouth every 4 (four) hours as needed for nausea or vomiting.    [provider]  Sodium Phosphates (FLEET ENEMA RE) Place 1 Dose rectally daily as needed (constipation not resolved by dulcolax supp).    [provider]  trofinetide  (DAYBUE ) 200 MG/ML SOLN Take 10,000 mg by mouth in the morning and at bedtime.    [provider]  Vitamins A & D (VITAMIN A & D) ointment Apply 1 Application topically in the morning, at noon, and at bedtime.    [provider]                                                                                 Vitals:   10/31/23 1515 10/31/23 1530 10/31/23 1545 10/31/23 1600  BP: 113/83 108/76 109/72 103/77  Pulse: 72 69 72 76  Resp: 12 17 19 19   Temp:      TempSrc:      SpO2: 100% 100% 100% 92%  Weight:       Physical Exam Constitutional:      General: She is awake. She is not in acute distress.    Appearance: She is not ill-appearing.  HENT:     Head: Normocephalic and atraumatic.     Comments: Left lower lip to me seems like she may have bit it and it is not sure if I would call this facial droop more so lower jaw tilted to left.     Right Ear: External ear normal.     Left Ear: External ear normal.     Mouth/Throat:      Mouth: Mucous membranes are moist.  Eyes:     General: Lids are normal.     Pupils: Pupils are equal, round, and reactive to light.     Comments: Left side preference. Pt is rotated to her left and her face is also turned towards left not necessarily gaze preference but def she looks at me when I am standing to her left and does not turn her heard to look right.   Cardiovascular:     Rate and Rhythm: Normal rate and regular rhythm.     Heart sounds: Normal heart sounds.  Pulmonary:     Effort: Pulmonary effort is normal.     Breath sounds: Normal breath sounds.  Abdominal:     General: Bowel sounds are normal.     Palpations: Abdomen is soft.  Musculoskeletal:     Right lower leg: No edema.     Left lower leg: No edema.  Neurological:     General: No focal deficit present.     Mental Status: She is alert.     Motor: Motor function is intact.  Psychiatric:        Speech: She is noncommunicative.     Labs on Admission: I have personally reviewed following labs and imaging studies CBC: Recent Labs  Lab 10/31/23 1445 10/31/23 1446  WBC 16.6*  --   NEUTROABS 13.4*  --   HGB 14.2 15.0  HCT 41.9 44.0  MCV 101.5*  --   PLT 271  --    Basic Metabolic Panel: Recent Labs  Lab 10/31/23 1445 10/31/23 1446  NA 140 141  K 4.0 3.9  CL 107 106  CO2 24  --   GLUCOSE 127* 122*  BUN 7 7  CREATININE 0.63 0.60  CALCIUM 9.1  --    GFR: Estimated Creatinine Clearance: 55.5 mL/min (by C-G formula based on SCr of 0.6 mg/dL). Liver Function Tests: Recent Labs  Lab 10/31/23 1445  AST 24  ALT 17  ALKPHOS 85  BILITOT 0.7  PROT 6.6  ALBUMIN  3.8   No results for input(s): "LIPASE", "AMYLASE" in the last 168 hours. No results for input(s): "AMMONIA" in the last 168 hours. Coagulation Profile: Recent Labs  Lab 10/31/23 1445  INR 1.0   Cardiac Enzymes: No results for input(s): "CKTOTAL", "CKMB", "CKMBINDEX", "TROPONINI" in the last 168 hours. BNP (last 3 results) No results  for input(s): "PROBNP" in the last 8760 hours. HbA1C: No results for input(s): "HGBA1C" in the last 72 hours. CBG: Recent Labs  Lab 10/31/23 1440  GLUCAP 128*   Lipid Profile: No results for input(s): "CHOL", "HDL", "LDLCALC", "TRIG", "CHOLHDL", "LDLDIRECT" in the last 72 hours. Thyroid  Function Tests: No results for input(s): "TSH", "T4TOTAL", "FREET4", "T3FREE", "THYROIDAB" in the last 72 hours. Anemia Panel: No results for input(s): "VITAMINB12", "FOLATE", "FERRITIN", "TIBC", "IRON", "RETICCTPCT" in the last 72 hours.  Radiological Exams on Admission: CT ANGIO HEAD NECK W WO CM Result Date: 10/31/2023 CLINICAL DATA:  Provided history: Stroke/TIA, determine embolic source. EXAM: CT ANGIOGRAPHY HEAD AND NECK WITH AND WITHOUT CONTRAST TECHNIQUE: Multidetector CT imaging of the head and neck was performed using the standard protocol during bolus administration of intravenous contrast. Multiplanar CT image reconstructions and MIPs were obtained to evaluate the vascular anatomy. Carotid stenosis measurements (when applicable) are obtained utilizing NASCET criteria, using the distal internal carotid diameter as the denominator. RADIATION DOSE REDUCTION: This exam was performed according to the departmental dose-optimization program which includes automated exposure control, adjustment of the mA and/or kV according to patient size and/or use of iterative reconstruction technique. CONTRAST:  75mL OMNIPAQUE  IOHEXOL  350 MG/ML SOLN COMPARISON:  Non-contrast head CT performed earlier today 10/31/2023. FINDINGS: CTA NECK FINDINGS Aortic arch: Standard aortic branching. The visualized thoracic aorta is normal in caliber. Streak/beam hardening artifact arising from a dense contrast bolus partially obscures the right subclavian artery. Within this limitation, there is no appreciable hemodynamically significant innominate or proximal subclavian artery stenosis. Right carotid system: CCA and ICA patent within the  neck without stenosis or significant atherosclerotic disease. No evidence of dissection. Left carotid system: CCA and ICA patent within the neck without stenosis or significant atherosclerotic disease. No evidence of dissection. Vertebral arteries: Vertebral arteries patent within the neck without stenosis or significant atherosclerotic disease. No evidence of dissection. The right vertebral artery is dominant. Skeleton: Nonspecific reversal of the expected cervical lordosis. Cervical spondylosis. No acute fracture or aggressive osseous lesion. Other neck: No neck mass or cervical lymphadenopathy. Upper chest: No consolidation within the imaged lung apices. Review of the MIP images confirms the above findings CTA HEAD FINDINGS Anterior circulation: The intracranial internal carotid arteries are patent. Non-stenotic calcified plaque within both vessels. The M1 middle cerebral arteries are patent. No M2 proximal branch occlusion or high-grade proximal stenosis. The anterior cerebral arteries are patent. No intracranial aneurysm is identified. Posterior circulation: The intracranial vertebral arteries are patent. The intracranial left vertebral artery is  developmentally diminutive beyond the PICA origin. The basilar artery is patent. The posterior cerebral arteries are patent. Severe stenosis within the right PCA at the P3 segment level (series 16, image 20) (series 15, image 18). The right PCA is fetal in origin. A left posterior communicating artery is present. Venous sinuses: Assessment for dural venous sinus thrombosis is limited due to contrast timing. Anatomic variants: As described. Review of the MIP images confirms the above findings No emergent large vessel occlusion identified. This result and CTA head impression #2 communicated to Dr. Doretta Gant At 4:11 pmon 4/21/2025by text page via the [facility] messaging system. Please note, delayed transfer of some series to PACS delayed image interpretation. IMPRESSION: CTA  neck: The common carotid, internal carotid and vertebral arteries are patent within the neck without stenosis or significant atherosclerotic disease. No evidence of dissection. CTA head: 1. No proximal intracranial large vessel occlusion identified. 2. 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 3. Non-stenotic atherosclerotic plaque within the intracranial internal carotid arteries. Electronically Signed   By: Bascom Lily D.O.   On: 10/31/2023 16:13   CT HEAD CODE STROKE WO CONTRAST Result Date: 10/31/2023 CLINICAL DATA:  Provided history: Neuro deficit, acute, stroke suspected. Left-sided facial droop. EXAM: CT HEAD WITHOUT CONTRAST TECHNIQUE: Contiguous axial images were obtained from the base of the skull through the vertex without intravenous contrast. RADIATION DOSE REDUCTION: This exam was performed according to the departmental dose-optimization program which includes automated exposure control, adjustment of the mA and/or kV according to patient size and/or use of iterative reconstruction technique. COMPARISON:  Non-contrast head CT 07/31/2016. FINDINGS: Brain: Mild generalized cerebral atrophy. Lacunar infarcts within the left corona radiata and right lentiform nucleus, new from the prior head CT of 07/31/2016 and age-indeterminate. There is no acute intracranial hemorrhage. No demarcated cortical infarct. No extra-axial fluid collection. No evidence of an intracranial mass. No midline shift. Vascular: No hyperdense vessel.  Atherosclerotic calcifications. Skull: No calvarial fracture or aggressive osseous lesion. Sinuses/Orbits: No mass or acute finding within the imaged orbits. Minimal mucosal thickening within the right maxillary and left ethmoid sinuses. Other: Left mastoid effusion. ASPECTS (Alberta Stroke Program Early CT Score) - Ganglionic level infarction (caudate, lentiform nuclei, internal capsule, insula, M1-M3 cortex): 6 - Supraganglionic infarction  (M4-M6 cortex): 3 Total score (0-10 with 10 being normal): 9 (due to the age-indeterminate lacunar infarct within the right lentiform nucleus). Results in impression #1 communicated to Dr. Doretta Gant at 3:16 pmon 4/21/2025by text page via the [facility] messaging system. IMPRESSION: 1. Lacunar infarcts within the right lentiform nucleus and left corona radiata, new from the prior head CT of 07/31/2016 and age-indeterminate. Consider a brain MRI for further evaluation. 2. Mild generalized cerebral atrophy. Electronically Signed   By: Bascom Lily D.O.   On: 10/31/2023 15:18   Data Reviewed: Relevant notes from primary care and specialist visits, past discharge summaries as available in EHR, including Care Everywhere. Prior diagnostic testing as pertinent to current admission diagnoses, Updated medications and problem lists for reconciliation ED course, including vitals, labs, imaging, treatment and response to treatment,Triage notes, nursing and pharmacy notes and ED provider's notes Notable results as noted in HPI.Discussed case with EDMD/ ED APP/ or Specialty MD on call and as needed.  Assessment & Plan  >> Left-sided weakness/left facial droop: Time for stroke workup MRI 2D echocardiogram.  Chest x-ray to identify aspiration pneumonia.  D-dimer for possibility of PE and DVT that can also present with altered mental status -  presentation.  Patient admitted with neurochecks.  >> Hypothyroidism: Free T4 and TSH. Resume home dose of Synthroid  once med rec is complete.  >> Leukocytosis: Chest x-ray ordered and cbc for am.   >> Seizure disorder: Aspiration seizure precautions alcohol precautions.  Continue patient's Keppra .  Continue patient's Tegretol .  Will check a Tegretol  level.   >>Retts syndrome: Will continue patient on her Daybue .  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      DVT prophylaxis:  Heparin  Consults:  Neurology Advance Care Planning:    Code Status: Prior   Family Communication:  Mother Disposition Plan:  Group home Severity of Illness: The appropriate patient status for this patient is INPATIENT. Inpatient status is judged to be reasonable and necessary in order to provide the required intensity of service to ensure the patient's safety. The patient's presenting symptoms, physical exam findings, and initial radiographic and laboratory data in the context of their chronic comorbidities is felt to place them at high risk for further clinical deterioration. Furthermore, it is not anticipated that the patient will be medically stable for discharge from the hospital within 2 midnights of admission.   * I certify that at the point of admission it is my clinical judgment that the patient will require inpatient hospital care spanning beyond 2 midnights from the point of admission due to high intensity of service, high risk for further deterioration and high frequency of surveillance required.*  Unresulted Labs (From admission, onward)     Start     Ordered   11/01/23 0500  Lipid panel  (Labs)  Tomorrow morning,   R       Comments: Fasting    10/31/23 1520   11/01/23 0500  Hemoglobin A1c  (Labs)  Tomorrow morning,   R       Comments: To assess prior glycemic control    10/31/23 1520   11/01/23 0500  Magnesium  Tomorrow morning,   R        10/31/23 1900   11/01/23 0500  CBC with Differential/Platelet  Tomorrow morning,   R        10/31/23 1900   11/01/23 0500  Phosphorus  Tomorrow morning,   R        10/31/23 1900   11/01/23 0500  Basic metabolic panel  Tomorrow morning,   R        10/31/23 1900   10/31/23 1900  D-dimer, quantitative  Add-on,   AD        10/31/23 1900   10/31/23 1858  Folate  Add-on,   AD        10/31/23 1859   10/31/23 1857  TSH  Add-on,   AD        10/31/23 1859   10/31/23 1857  T4, free  Add-on,   AD        10/31/23  1859   10/31/23 1857  Vitamin B12  Add-on,   AD        10/31/23 1859   10/31/23 1530  hCG, serum, qualitative  Once,   R        10/31/23 1530            Orders Placed This Encounter  Procedures   CT HEAD CODE STROKE WO CONTRAST   CT ANGIO HEAD NECK W WO CM   MR BRAIN WO CONTRAST  DG Chest 1 View   Protime-INR   APTT   CBC   Differential   Comprehensive metabolic panel   Ethanol   hCG, serum, qualitative   Lipid panel   Hemoglobin A1c   TSH   T4, free   Vitamin B12   Folate   Magnesium   CBC with Differential/Platelet   Phosphorus   D-dimer, quantitative   Basic metabolic panel   Diet NPO time specified   ED Cardiac monitoring   NIH Stroke Scale   Saline Lock IV, Maintain IV access   If O2 sat   NIHSS score documentation NIHSS score range: 0-42   Vital signs   Notify physician (specify)   Swallow screen - If patient does NOT pass this screen, place order for SLP eval and treat (SLP2) - swallowing evaluation (BSE, MBS and/or diet order as indicated)   RN to notify Physician for appropriate diet after patient passes swallow screen   NIH Stroke Scale   Intake and output   Cardiac Monitoring - Continuous Indefinite   Apply Stroke Care Plan: Ischemic Stroke, TIA   Initiate Adult Central Line Maintenance and Catheter Protocol for patients with central line (CVC, PICC, Port, Hemodialysis, Trialysis)   Discuss with patient and document patient's goals for stroke risk factor reduction   Initiate Oral Care Protocol   Initiate Carrier Fluid Protocol   Provide stroke education material to patient and family   Nurse to provide smoking / tobacco cessation education   OOB with assistance   Ambulate with assistance   Clerk to Activate Code Stroke   Consult to hospitalist   OT eval and treat   PT eval and treat   Oxygen therapy Mode or (Route): Nasal cannula; Liters Per Minute: 2; Keep O2 saturation between: greater than 94 %   SLP eval and treat Reason for evaluation:  Cognitive/Language evaluation   I-stat chem 8, ED   CBG monitoring, ED   ED EKG   EKG 12-Lead   ECHOCARDIOGRAM COMPLETE   Admit to Inpatient (patient's expected length of stay will be greater than 2 midnights or inpatient only procedure)   Fall precautions   Seizure precautions   Aspiration precautions   Skin care precautions    Author: Lavanda Porter, MD 12 pm -8 pm. 10/31/2023 7:07 PM >>Please note for any concern,or critical results after hours past 8pm please contact the [facility] hospitalist [facility] floor coverage provider from 7 PM- 7 AM. For on call review [email or web address], username [email or web address] and PW: your phone number<<

## 2023-10-31 NOTE — ED Provider Notes (Addendum)
 [place] EMERGENCY DEPARTMENT AT [facility] Provider Note   CSN: [id number] Arrival date & time: 10/31/23  1437  An emergency department physician performed an initial assessment on this suspected stroke patient at 1439.  History  Chief Complaint  Patient presents with   Code Stroke    Megan Walton is a 53 y.o. female.  Patient is a 53 year old female with a past medical history of Rett syndrome, seizure disorder, nonverbal but ambulatory with help at baseline, developmental delay and hypothyroidism presenting to the emergency department as a code stroke.  Per EMS, the patient had sudden onset of left-sided facial droop with questionable left-sided weakness around 12 pm today.  Patient caregiver at bedside does not normally care for her and does not know much about her baseline and patient is unable to provide any additional history.  The history is provided by the EMS personnel. The history is limited by a developmental delay.       Home Medications Prior to Admission medications   Medication Sig Start Date End Date Taking? Authorizing Provider  acyclovir  (ZOVIRAX ) 400 MG tablet Take 400 mg by mouth 2 (two) times daily.    [provider]  Alum & Mag Hydroxide-Simeth (ANTACID ANTI-GAS PO) Take 15-30 mLs by mouth See admin instructions. Take 15 ml < 100 lbs 30 for >100 lbs by mouth every 2 hours as needed. Prn reason not listed    [provider]  bisacodyl (DULCOLAX) 10 MG suppository Place 10 mg rectally daily as needed for moderate constipation or mild constipation (if constipation is not relieved by MOM).    [provider]  Calcium Carbonate Antacid (CAL-GEST ANTACID PO) Take 1 tablet by mouth in the morning.    [provider]  carbamazepine  (TEGRETOL ) 100 MG chewable tablet Chew 200-300 mg by mouth See admin instructions. Take 300 mg by mouth at 0800 and 2000. Take 200 mg by mouth at 1100 and 1600.    [provider]   Cholecalciferol (VITAMIN D) 50 MCG (2000 UT) CAPS Take 4,000 Units by mouth daily.    [provider]  diazePAM  (VALTOCO  15 MG DOSE NA) Place 15 mg into the nose as needed (seizures).    [provider]  diphenhydrAMINE  (BENADRYL ) 25 MG tablet Take 25 mg by mouth every 4 (four) hours as needed for allergies (nasal congestion).    [provider]  guaiFENesin (CHEST CONGESTION RELIEF) 100 MG/5ML liquid Take 300 mg by mouth every 4 (four) hours as needed for cough or to loosen phlegm.    [provider]  levETIRAcetam  (KEPPRA ) 1000 MG tablet Take 1,000 mg by mouth 2 (two) times daily.    [provider]  levETIRAcetam  (KEPPRA ) 250 MG tablet Take 250 mg by mouth at bedtime.    [provider]  levOCARNitine (CARNITOR) 330 MG tablet Take 330 mg by mouth 2 (two) times daily.    [provider]  levothyroxine  (SYNTHROID , LEVOTHROID) 50 MCG tablet Take 50 mcg by mouth daily.    [provider]  loperamide (IMODIUM A-D) 2 MG tablet Take 2-4 mg by mouth as needed for diarrhea or loose stools. Take 4 mg by mouth for the initial first dose. Then take 2 mg as needed for loose stools    [provider]  magnesium hydroxide (MILK OF MAGNESIA) 400 MG/5ML suspension Take 15-30 mLs by mouth daily as needed (no BM in 3 days). 15 ml < 100 lbs 30 ml > 100 lbs  [provider]  megestrol  (MEGACE ) 40 MG/ML suspension Take 400 mg by mouth daily.    [provider]  Nutritional Supplements (BENECALORIE) LIQD Take 44 mLs by mouth at bedtime.    [provider]  pantoprazole  (PROTONIX ) 40 MG tablet Take 1 tablet (40 mg total) by mouth 2 (two) times daily. TWICE A DAY FOR 30 DAYS, THEN ONCE DAILY 10/02/23   Dezii, Alexandra, DO  phenytoin  (DILANTIN ) 50 MG tablet Chew 100 mg by mouth daily.    [provider]  promethazine (PHENERGAN) 25 MG tablet Take 25 mg by mouth every 4 (four) hours as needed for nausea or  vomiting.    [provider]  Sodium Phosphates (FLEET ENEMA RE) Place 1 Dose rectally daily as needed (constipation not resolved by dulcolax supp).    [provider]  trofinetide  (DAYBUE ) 200 MG/ML SOLN Take 10,000 mg by mouth in the morning and at bedtime.    [provider]  Vitamins A & D (VITAMIN A & D) ointment Apply 1 Application topically in the morning, at noon, and at bedtime.    [provider]      Allergies    Valproic acid , Cephalexin, Clarithromycin, Macrolides and ketolides, and Penicillins    Review of Systems   Review of Systems  Physical Exam Updated Vital Signs BP 119/77   Pulse 74   Temp 97.6 F (36.4 C) (Oral)   Resp 14   Wt 48.8 kg   SpO2 100%   BMI 21.73 kg/m  Physical Exam Vitals and nursing note reviewed.  Constitutional:      General: She is not in acute distress.    Appearance: Normal appearance.  HENT:     Head: Normocephalic and atraumatic.     Nose: Nose normal.     Mouth/Throat:     Mouth: Mucous membranes are moist.  Eyes:     Extraocular Movements: Extraocular movements intact.     Conjunctiva/sclera: Conjunctivae normal.     Pupils: Pupils are equal, round, and reactive to light.  Cardiovascular:     Rate and Rhythm: Normal rate and regular rhythm.     Heart sounds: Normal heart sounds.  Pulmonary:     Effort: Pulmonary effort is normal.     Breath sounds: Normal breath sounds.  Abdominal:     General: Abdomen is flat.     Palpations: Abdomen is soft.     Tenderness: There is no abdominal tenderness.  Musculoskeletal:        General: Normal range of motion.     Cervical back: Normal range of motion.  Skin:    General: Skin is warm and dry.  Neurological:     Mental Status: She is alert.     Comments: Non-verbal Possible droop to L lower lip Moving all 4 extremities spontaneously and resisting passive ROM in all 4 extremities, but does not follow commands  Psychiatric:        Mood and  Affect: Mood normal.        Behavior: Behavior normal.    ED Results / Procedures / Treatments   Labs (all labs ordered are listed, but only abnormal results are displayed) Labs Reviewed  CBC - Abnormal; Notable for the following components:      Result Value   WBC 16.6 (*)    MCV 101.5 (*)    MCH 34.4 (*)    All other components within normal limits  DIFFERENTIAL - Abnormal; Notable for the following components:  Neutro Abs 13.4 (*)    Monocytes Absolute 1.1 (*)    All other components within normal limits  COMPREHENSIVE METABOLIC PANEL WITH GFR - Abnormal; Notable for the following components:   Glucose, Bld 127 (*)    All other components within normal limits  I-STAT CHEM 8, ED - Abnormal; Notable for the following components:   Glucose, Bld 122 (*)    All other components within normal limits  CBG MONITORING, ED - Abnormal; Notable for the following components:   Glucose-Capillary 128 (*)    All other components within normal limits  PROTIME-INR  APTT  ETHANOL  HCG, SERUM, QUALITATIVE    EKG EKG Interpretation Date/Time:  Monday October 31 2023 15:08:06 EDT Ventricular Rate:  72 PR Interval:  174 QRS Duration:  90 QT Interval:  384 QTC Calculation: 421 R Axis:   58  Text Interpretation: Sinus rhythm Low voltage, precordial leads No significant change since last tracing Confirmed by Celesta Coke ([id number]) on 10/31/2023 3:09:42 PM  Radiology CT HEAD CODE STROKE WO CONTRAST Result Date: 10/31/2023 CLINICAL DATA:  Provided history: Neuro deficit, acute, stroke suspected. Left-sided facial droop. EXAM: CT HEAD WITHOUT CONTRAST TECHNIQUE: Contiguous axial images were obtained from the base of the skull through the vertex without intravenous contrast. RADIATION DOSE REDUCTION: This exam was performed according to the departmental dose-optimization program which includes automated exposure control, adjustment of the mA and/or kV according to patient size and/or use of  iterative reconstruction technique. COMPARISON:  Non-contrast head CT 07/31/2016. FINDINGS: Brain: Mild generalized cerebral atrophy. Lacunar infarcts within the left corona radiata and right lentiform nucleus, new from the prior head CT of 07/31/2016 and age-indeterminate. There is no acute intracranial hemorrhage. No demarcated cortical infarct. No extra-axial fluid collection. No evidence of an intracranial mass. No midline shift. Vascular: No hyperdense vessel.  Atherosclerotic calcifications. Skull: No calvarial fracture or aggressive osseous lesion. Sinuses/Orbits: No mass or acute finding within the imaged orbits. Minimal mucosal thickening within the right maxillary and left ethmoid sinuses. Other: Left mastoid effusion. ASPECTS (Alberta Stroke Program Early CT Score) - Ganglionic level infarction (caudate, lentiform nuclei, internal capsule, insula, M1-M3 cortex): 6 - Supraganglionic infarction (M4-M6 cortex): 3 Total score (0-10 with 10 being normal): 9 (due to the age-indeterminate lacunar infarct within the right lentiform nucleus). Results in impression #1 communicated to Dr. Doretta Gant at 3:16 pmon 4/21/2025by text page via the [facility] messaging system. IMPRESSION: 1. Lacunar infarcts within the right lentiform nucleus and left corona radiata, new from the prior head CT of 07/31/2016 and age-indeterminate. Consider a brain MRI for further evaluation. 2. Mild generalized cerebral atrophy. Electronically Signed   By: Bascom Lily D.O.   On: 10/31/2023 15:18    Procedures Procedures    Medications Ordered in ED Medications  sodium chloride  flush (NS) 0.9 % injection 3 mL (has no administration in time range)  LORazepam  (ATIVAN ) injection 1 mg (has no administration in time range)   stroke: early stages of recovery book (has no administration in time range)  iohexol  (OMNIPAQUE ) 350 MG/ML injection 75 mL (75 mLs Intravenous Contrast Given 10/31/23 1458)    ED Course/ Medical Decision Making/ A&P                                  Medical Decision Making This patient presents to the ED with chief complaint(s) of code stroke with pertinent past medical history of Rett's syndrome, seizure disorder,  developmental delay non-verbal but ambulatory at baseline, hypothyroidism which further complicates the presenting complaint. The complaint involves an extensive differential diagnosis and also carries with it a high risk of complications and morbidity.    The differential diagnosis includes CVA, TIA, ICH, mass effect, electrolyte abnormality, infection  Additional history obtained: Additional history obtained from EMS  Records reviewed previous admission documents, care everywhere  ED Course and Reassessment: On patient's arrival she was hemodynamically stable in no acute distress.  Was made a prehospital code stroke and was evaluated by neurology at the door.  Patient's head CT showed lacunar infarcts within the right lentiform nucleus and left corona radiata, new from the prior head CT. Dr. Doretta Gant with neurology deemed the patient not a TNK candidate and recommended admission for TIA work up.   Independent labs interpretation:  The following labs were independently interpreted: leukocytosis, otherwise within normal range  Independent visualization of imaging: - I independently visualized the following imaging with scope of interpretation limited to determining acute life threatening conditions related to emergency care: CTH, which revealed Lacunar infarcts within the right lentiform nucleus and left corona radiata, new from the prior head CT  Consultation: - Consulted or discussed management/test interpretation w/ external professional: neurology, hospitalist  Consideration for admission or further workup: patient requires admission for TIA work up Social Determinants of health: N/A    Amount and/or Complexity of Data Reviewed Labs: ordered. Radiology: ordered.  Risk Decision regarding  hospitalization.          Final Clinical Impression(s) / ED Diagnoses Final diagnoses:  TIA (transient ischemic attack)    Rx / DC Orders ED Discharge Orders     None         Kingsley, Randie Tallarico K, DO 10/31/23 1537    Corinth, Kinte Trim K, DO 10/31/23 2255

## 2023-10-31 NOTE — ED Notes (Signed)
 PT was unable to lay still without being medicated.This RN walked to MRI and gave the PT 0.5 of ativan .

## 2023-10-31 NOTE — ED Notes (Signed)
 Patient transported to MRI with this RN bedside

## 2023-10-31 NOTE — ED Triage Notes (Signed)
 Pt from group home, LSN 1200- left sided facial droop. Pt is non verbal at baseline and cognitively delayed

## 2023-10-31 NOTE — Consult Note (Signed)
 NEUROLOGY CONSULT NOTE   Date of service: October 31, 2023 Patient Name: Megan Walton MRN:  [id number] DOB:  [birth year] Chief Complaint: "Left-sided weakness and left facial droop" Requesting Provider: Nolberto Batty, DO  History of Present Illness  Megan Walton is a 53 y.o. female with hx of Rett syndrome, seizures and constipation who lives in a group home at baseline presented with sudden onset left facial droop and questionable left-sided weakness.  Patient is at baseline nonverbal and can ambulate with assistance.  Today, her caregivers at the group home noticed that she had developed a left-sided facial droop and possible left-sided weakness.  On exam, she has noted to have left nasolabial fold flattening but no other focal symptoms.  LKW: 1200 Modified rankin score: 4-Needs assistance to walk and tend to bodily needs IV Thrombolysis: No, symptoms too mild to treat EVT: No, no LVO  NIHSS components Score: Comment  1a Level of Conscious 0[x]  1[]  2[]  3[]      1b LOC Questions 0[]  1[]  2[x]       1c LOC Commands 0[]  1[]  2[x]       2 Best Gaze 0[x]  1[]  2[]       3 Visual 0[x]  1[]  2[]  3[]      4 Facial Palsy 0[]  1[x]  2[]  3[]      5a Motor Arm - left 0[]  1[x]  2[]  3[]  4[]  UN[]    5b Motor Arm - Right 0[]  1[x]  2[]  3[]  4[]  UN[]    6a Motor Leg - Left 0[]  1[x]  2[]  3[]  4[]  UN[]    6b Motor Leg - Right 0[]  1[]  2[x]  3[]  4[]  UN[]    7 Limb Ataxia 0[x]  1[]  2[]  3[]  UN[]     8 Sensory 0[x]  1[]  2[]  UN[]      9 Best Language 0[]  1[]  2[]  3[x]      10 Dysarthria 0[]  1[]  2[]  UN[x]      11 Extinct. and Inattention 0[x]  1[]  2[]       TOTAL:13       ROS   Unable to ascertain due to patient being nonverbal  Past History   Past Medical History:  Diagnosis Date   Constipation, chronic    Developmental delay    Foot fracture    Herpes simplex type 1 antibody positive    Hypothyroidism    Mental retardation    Mental retardation    MR (mental retardation)    profound   Osteopenia    Right Hip    Rett syndrome    Rett's syndrome    Rett's syndrome    Seizures (HCC)    Thyroid disease     Past Surgical History:  Procedure Laterality Date   ESOPHAGOGASTRODUODENOSCOPY N/A 09/30/2023   Procedure: EGD (ESOPHAGOGASTRODUODENOSCOPY);  Surgeon: Alvis Jourdain, MD;  Location: [facility];  Service: Gastroenterology;  Laterality: N/A;   FOREIGN BODY REMOVAL  09/30/2023   Procedure: REMOVAL, FOREIGN BODY;  Surgeon: Alvis Jourdain, MD;  Location: [facility];  Service: Gastroenterology;;    Family History: Family History  Problem Relation Age of Onset   Lymphoma Father    Healthy Mother    Healthy Sister    ADD / ADHD Brother    Healthy Brother     Social History  reports that she has never smoked. She has never used smokeless tobacco. She reports that she does not drink alcohol and does not use drugs.  Allergies  Allergen Reactions   Valproic Acid  Other (See Comments)    Unknown. Reaction not listed on MAR    Cephalexin Other (See Comments)  Unknown. Reaction not listed on MAR    Clarithromycin Other (See Comments)    Unknown. Reaction not listed on MAR    Macrolides And Ketolides Other (See Comments)    Unknown. Reaction not listed on MAR    Penicillins Other (See Comments)    Unknown. Reaction not listed on MAR     Medications   Current Facility-Administered Medications:    LORazepam  (ATIVAN ) injection 1 mg, 1 mg, Intravenous, Once, [employer], Cortney E, NP   sodium chloride  flush (NS) 0.9 % injection 3 mL, 3 mL, Intravenous, Once, Kingsley, Victoria K, DO  Current Outpatient Medications:    acyclovir  (ZOVIRAX ) 400 MG tablet, Take 400 mg by mouth 2 (two) times daily., Disp: , Rfl:    Alum & Mag Hydroxide-Simeth (ANTACID ANTI-GAS PO), Take 15-30 mLs by mouth See admin instructions. Take 15 ml < 100 lbs 30 for >100 lbs by mouth every 2 hours as needed. Prn reason not listed, Disp: , Rfl:    bisacodyl (DULCOLAX) 10 MG suppository, Place 10 mg rectally daily as  needed for moderate constipation or mild constipation (if constipation is not relieved by MOM)., Disp: , Rfl:    Calcium Carbonate Antacid (CAL-GEST ANTACID PO), Take 1 tablet by mouth in the morning., Disp: , Rfl:    carbamazepine  (TEGRETOL ) 100 MG chewable tablet, Chew 200-300 mg by mouth See admin instructions. Take 300 mg by mouth at 0800 and 2000. Take 200 mg by mouth at 1100 and 1600., Disp: , Rfl:    Cholecalciferol (VITAMIN D) 50 MCG (2000 UT) CAPS, Take 4,000 Units by mouth daily., Disp: , Rfl:    diazePAM  (VALTOCO  15 MG DOSE NA), Place 15 mg into the nose as needed (seizures)., Disp: , Rfl:    diphenhydrAMINE  (BENADRYL ) 25 MG tablet, Take 25 mg by mouth every 4 (four) hours as needed for allergies (nasal congestion)., Disp: , Rfl:    guaiFENesin (CHEST CONGESTION RELIEF) 100 MG/5ML liquid, Take 300 mg by mouth every 4 (four) hours as needed for cough or to loosen phlegm., Disp: , Rfl:    levETIRAcetam  (KEPPRA ) 1000 MG tablet, Take 1,000 mg by mouth 2 (two) times daily., Disp: , Rfl:    levETIRAcetam  (KEPPRA ) 250 MG tablet, Take 250 mg by mouth at bedtime., Disp: , Rfl:    levOCARNitine (CARNITOR) 330 MG tablet, Take 330 mg by mouth 2 (two) times daily., Disp: , Rfl:    levothyroxine  (SYNTHROID , LEVOTHROID) 50 MCG tablet, Take 50 mcg by mouth daily., Disp: , Rfl:    loperamide (IMODIUM A-D) 2 MG tablet, Take 2-4 mg by mouth as needed for diarrhea or loose stools. Take 4 mg by mouth for the initial first dose. Then take 2 mg as needed for loose stools, Disp: , Rfl:    magnesium hydroxide (MILK OF MAGNESIA) 400 MG/5ML suspension, Take 15-30 mLs by mouth daily as needed (no BM in 3 days). 15 ml < 100 lbs 30 ml > 100 lbs, Disp: , Rfl:    megestrol  (MEGACE ) 40 MG/ML suspension, Take 400 mg by mouth daily., Disp: , Rfl:    Nutritional Supplements (BENECALORIE) LIQD, Take 44 mLs by mouth at bedtime., Disp: , Rfl:    pantoprazole  (PROTONIX ) 40 MG tablet, Take 1 tablet (40 mg total) by mouth 2 (two)  times daily. TWICE A DAY FOR 30 DAYS, THEN ONCE DAILY, Disp: 60 tablet, Rfl: 0   phenytoin  (DILANTIN ) 50 MG tablet, Chew 100 mg by mouth daily., Disp: , Rfl:  promethazine (PHENERGAN) 25 MG tablet, Take 25 mg by mouth every 4 (four) hours as needed for nausea or vomiting., Disp: , Rfl:    Sodium Phosphates (FLEET ENEMA RE), Place 1 Dose rectally daily as needed (constipation not resolved by dulcolax supp)., Disp: , Rfl:    trofinetide  (DAYBUE ) 200 MG/ML SOLN, Take 10,000 mg by mouth in the morning and at bedtime., Disp: , Rfl:    Vitamins A & D (VITAMIN A & D) ointment, Apply 1 Application topically in the morning, at noon, and at bedtime., Disp: , Rfl:   Vitals   Vitals:   11/09/23 1400 11/09/2023 1505 2023-11-09 1507 11/09/23 1508  BP:  119/77    Pulse:  (!) 36 74   Resp:  16 14   Temp:    97.6 F (36.4 C)  TempSrc:    Oral  SpO2:  (!) 83% 100%   Weight: 48.8 kg       Body mass index is 21.73 kg/m.  Physical Exam   Constitutional: Thin, chronically ill-appearing middle-aged patient in no acute distress Psych: Affect blunted Eyes: No scleral injection.  HENT: No OP obstruction.  Head: Normocephalic.  Respiratory: Effort normal, non-labored breathing.  Skin: WDI.   Neurologic Examination    NEURO:  Mental Status: Patient is alert but nonverbal, does not respond to name or follow commands.  She will withdraw to noxious stimuli and exhibits frequent hand wringing motions with bilateral hands Speech/Language: Patient is nonverbal but will vocalize at times  Cranial Nerves:  II: PERRL.  III, IV, VI: EOMI. Eyelids elevate symmetrically.  VII: Left nasolabial fold flattening. XII: Noncooperative with tongue protrusion Motor: Moves bilateral upper and lower extremities with antigravity strength Tone: is normal and bulk is normal Sensation-sensation appears to be intact to light touch and noxious throughout Coordination: Unable to perform Gait-  deferred    Labs/Imaging/Neurodiagnostic studies   CBC:  Recent Labs  Lab 11-09-23 1445 2023-11-09 1446  WBC 16.6*  --   NEUTROABS 13.4*  --   HGB 14.2 15.0  HCT 41.9 44.0  MCV 101.5*  --   PLT 271  --    Basic Metabolic Panel:  Lab Results  Component Value Date   NA 141 11/09/23   K 3.9 11-09-23   CO2 21 (L) 09/30/2023   GLUCOSE 122 (H) 11-09-2023   BUN 7 Nov 09, 2023   CREATININE 0.60 11-09-2023   CALCIUM 8.4 (L) 09/30/2023   GFRNONAA >60 09/30/2023   GFRAA >90 10/08/2011   Lipid Panel: No results found for: "LDLCALC" HgbA1c: No results found for: "HGBA1C" Urine Drug Screen: No results found for: "LABOPIA", "COCAINSCRNUR", "LABBENZ", "AMPHETMU", "THCU", "LABBARB"  Alcohol Level No results found for: "ETH" INR No results found for: "INR" APTT No results found for: "APTT" AED levels: No results found for: "PHENYTOIN ", "ZONISAMIDE", "LAMOTRIGINE", "LEVETIRACETA"  CT Head without contrast(Personally reviewed): Age-indeterminate lacunar infarcts within right lentiform nucleus and left corona radiata  CT angio Head and Neck with contrast(Personally reviewed): No LVO, severe right P3 segment stenosis  MRI Brain(Personally reviewed): Pending   ASSESSMENT   Megan Walton is a 53 y.o. female with a history of Rett syndrome, seizures and constipation who presents with sudden onset left facial droop and questionable left-sided weakness.  Patient lives in a group home and at baseline is nonverbal and can walk with assistance.  On exam, she is noted to have left nasolabial fold flattening but no other focal deficits.  CT head reveals age-indeterminate lacunar infarcts within right lentiform  nucleus and left corona radiata.  Will proceed with stroke workup.  RECOMMENDATIONS   - Admit for stroke workup - Permissive HTN x48 hrs from sx onset \\goal  BP <220/110. PRN labetalol or hydralazine if BP above these parameters. Avoid oral antihypertensives. - MRI brain wo  contrast - TTE w/ bubble - Check A1c and LDL + add statin per guidelines - ASA 81mg  daily + plavix 75mg  daily x90 days f/b ASA 81mg  daily monotherapy after that - q4 hr neuro checks - STAT head CT for any change in neuro exam - Tele - PT/OT/SLP - Stroke education - Amb referral to neurology upon discharge    Stroke team to assume care tomorrow ______________________________________________________________________  Patient seen by NP with MD, MD to edit note as needed.  Signed, Cortney E Bucky Cardinal, NP [facility]    Attending Neurohospitalist Addendum Patient seen and examined with APP/Resident. Agree with the history and physical as documented above. Agree with the plan as documented, which I helped formulate. I have edited the note above to reflect my full findings and recommendations. I have independently reviewed the chart, obtained history, review of systems and examined the patient.I have personally reviewed pertinent head/neck/spine imaging (CT/MRI). Please feel free to call with any questions.  -- Greg Leaks, MD [facility] [phone]  If 7pm- 7am, please page neurology on call as listed in AMION.

## 2023-11-01 ENCOUNTER — Other Ambulatory Visit (HOSPITAL_COMMUNITY): Payer: Self-pay

## 2023-11-01 DIAGNOSIS — F842 Rett's syndrome: Secondary | ICD-10-CM

## 2023-11-01 DIAGNOSIS — R531 Weakness: Secondary | ICD-10-CM | POA: Diagnosis not present

## 2023-11-01 LAB — BASIC METABOLIC PANEL WITH GFR
Anion gap: 10 (ref 5–15)
BUN: <5 mg/dL — ABNORMAL LOW (ref 6–20)
CO2: 21 mmol/L — ABNORMAL LOW (ref 22–32)
Calcium: 8.7 mg/dL — ABNORMAL LOW (ref 8.9–10.3)
Chloride: 111 mmol/L (ref 98–111)
Creatinine, Ser: 0.58 mg/dL (ref 0.44–1.00)
GFR, Estimated: >60 mL/min (ref >60)
Glucose, Bld: 103 mg/dL — ABNORMAL HIGH (ref 70–99)
Potassium: 3.7 mmol/L (ref 3.5–5.1)
Sodium: 142 mmol/L (ref 135–145)

## 2023-11-01 LAB — CBC WITH DIFFERENTIAL/PLATELET
Abs Immature Granulocytes: 0.02 10*3/uL (ref 0.00–0.07)
Basophils Absolute: 0 10*3/uL (ref 0.0–0.1)
Basophils Relative: 1 %
Eosinophils Absolute: 0.2 10*3/uL (ref 0.0–0.5)
Eosinophils Relative: 3 %
HCT: 42 % (ref 36.0–46.0)
Hemoglobin: 13.6 g/dL (ref 12.0–15.0)
Immature Granulocytes: 0 %
Lymphocytes Relative: 29 %
Lymphs Abs: 1.8 10*3/uL (ref 0.7–4.0)
MCH: 34 pg (ref 26.0–34.0)
MCHC: 32.4 g/dL (ref 30.0–36.0)
MCV: 105 fL — ABNORMAL HIGH (ref 80.0–100.0)
Monocytes Absolute: 0.6 10*3/uL (ref 0.1–1.0)
Monocytes Relative: 9 %
Neutro Abs: 3.8 10*3/uL (ref 1.7–7.7)
Neutrophils Relative %: 58 %
Platelets: 230 10*3/uL (ref 150–400)
RBC: 4 MIL/uL (ref 3.87–5.11)
RDW: 12.7 % (ref 11.5–15.5)
WBC: 6.4 10*3/uL (ref 4.0–10.5)
nRBC: 0 % (ref 0.0–0.2)

## 2023-11-01 LAB — LIPID PANEL
Cholesterol: 161 mg/dL (ref 0–200)
HDL: 48 mg/dL (ref >40)
LDL Cholesterol: 94 mg/dL (ref 0–99)
Total CHOL/HDL Ratio: 3.4 ratio
Triglycerides: 97 mg/dL (ref <150)
VLDL: 19 mg/dL (ref 0–40)

## 2023-11-01 LAB — FOLATE: Folate: 7.6 ng/mL (ref >5.9)

## 2023-11-01 LAB — VITAMIN B12: Vitamin B-12: 225 pg/mL (ref 180–914)

## 2023-11-01 LAB — T4, FREE: Free T4: 0.8 ng/dL (ref 0.61–1.12)

## 2023-11-01 LAB — MAGNESIUM: Magnesium: 2 mg/dL (ref 1.7–2.4)

## 2023-11-01 LAB — PHOSPHORUS: Phosphorus: 3.4 mg/dL (ref 2.5–4.6)

## 2023-11-01 LAB — HEMOGLOBIN A1C
Hgb A1c MFr Bld: 4.2 % — ABNORMAL LOW (ref 4.8–5.6)
Mean Plasma Glucose: 73.84 mg/dL

## 2023-11-01 LAB — CARBAMAZEPINE LEVEL, TOTAL: Carbamazepine Lvl: 3.2 ug/mL — ABNORMAL LOW (ref 4.0–12.0)

## 2023-11-01 LAB — TSH: TSH: 2.312 u[IU]/mL (ref 0.350–4.500)

## 2023-11-01 MED ORDER — CYANOCOBALAMIN 1000 MCG/ML IJ SOLN
1000.0000 ug | Freq: Once | INTRAMUSCULAR | Status: AC
  Administered 2023-11-01: 1000 ug via INTRAMUSCULAR
  Filled 2023-11-01: qty 1

## 2023-11-01 MED ORDER — VITAMIN B-12 1000 MCG PO TABS
1000.0000 ug | ORAL_TABLET | Freq: Every day | ORAL | Status: DC
  Filled 2023-11-01: qty 1

## 2023-11-01 MED ORDER — CYANOCOBALAMIN 1000 MCG PO TABS
1000.0000 ug | ORAL_TABLET | Freq: Every day | ORAL | 0 refills | Status: AC
  Filled 2023-11-01: qty 90, 90d supply, fill #0

## 2023-11-01 NOTE — Care Management CC44 (Signed)
 Condition Code 44 Documentation Completed  Patient Details  Name: JANETTE HARVIE MRN: [id number] Date of Birth: 09/11/1970   Condition Code 44 given:  Yes Patient signature on Condition Code 44 notice:  Yes Documentation of 2 MD's agreement:  Yes Code 44 added to claim:  Yes    Joanette Moynahan, RN 11/01/2023, 1:41 PM

## 2023-11-01 NOTE — Discharge Summary (Signed)
 [facility] Discharge Summary   Patient: Megan Walton:[id number]  PCP: Megan Walton  Date of admission: 10/31/2023   Date of discharge:  11/01/2023     Discharge Diagnoses:  Principal Problem:   Left-sided weakness   Admitted From: Group home Disposition: Group home   Recommendations for Outpatient Follow-up:  PCP: in 1 week Follow-up with GYN for left adnexal mass, needs transvaginal sonogram as an outpatient. Follow up LABS/TEST:  repeat B12 and folate level after 3 months.  Transvaginal sonogram in 1 to 2 weeks   Follow-up Information     Walton, Megan Blanch Follow up in 1 week(s).   Specialty: Family Medicine Contact information: [address] [place] [address] [phone]                Diet recommendation: Regular diet  Activity: The patient is advised to gradually reintroduce usual activities, as tolerated  Discharge Condition: stable  Code Status: Full code   History of present illness: As per the H and P dictated on admission Hospital Course:  Megan Walton is a 53 y.o. female with past medical history  of Rett syndrome, seizure disorder, nonverbal but ambulatory with help at baseline, developmental delay and hypothyroidism presenting to the emergency department as a code stroke for left-sided weakness and left facial droop noted at the group home.  Patient is nonverbal and therefore HPI is limited.  Mom at bedside states that she was doing well a day ago and eating and drinking.  She was recently discharged from [facility] a few weeks ago after being admitted for an esophageal blockage.  ED Course: Pt in ed at bedside  is awake cooperative nonverbal.  O2 sats of 83% noted Initial EKG today sinus rhythm 72 PR 174 QTc of 421 low voltage throughout. CT showed lacunar infarcts which are new from the previous CT head and neurology is consulted patient is not a TNK candidate and recommended admission for code stroke workup. Blood work  today showed normal CMP with a glucose of 127. CBC shows a white count of 16.6 normal hemoglobin MCV 101.5 normal platelets. Chest x-ray ordered and pending. MRI Brain: No acute intracranial abnormality. 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   Assessment & Plan   Strokelike symptoms, patient presented with left-sided weakness and left facial droop.  Patient herself is nonverbal, facial droop was noticed by group home. CT head showed lacunar infarct.  MRI negative for any acute findings.  Complete report as above. No need of 2D echocardiogram as per neurology. LDL 94, A1c 4.2, TSH 2.3 within normal range. Stroke ruled out, patient was cleared by neurology to discharge.  Discussed with patient's mother and she agreed with the discharge planning.  # Seizure disorder: Resume home medications.  No seizure activity during hospital stay.  # Retts syndrome: continued Daybue .  # Left adnexal mass / cyst on ovary: Transabdominal ultrasound reviewed.  Recommended transvaginal sonogram.  Patient is not back to her baseline so that we will be difficult to get it done at this time.  Recommend to get it done as an outpatient follow with PCP.  Follow-up with GYN as an outpatient.  Discussed with patient's mother.  # Hypothyroidism:  Synthroid  continued  # Vitamin B12 level 225, goal >400.  Vitamin B-12 1000 mcg IM injection one-time dose given, started vitamin B12 1000 mcg p.o. daily for 3 months.  Follow with PCP to repeat B12 level after 3 months.  #  Folic acid level 7.6 within normal range but at lower end.  Recommend to follow with PCP and recheck folic acid level after 3 to 6 months.   Body mass index is 21.73 kg/m.  Nutrition Interventions:   On the day of the discharge the patient's vitals were stable, and no other acute medical condition were reported by patient's mother. the patient was felt safe to be discharge at group home. Patient's mother agreed  with the discharge planning.  Consultants: Neurologist Procedures: None  Discharge Exam: General: Appear in no distress, no Rash; Oral Mucosa Clear, moist. Cardiovascular: S1 and S2 Present, no Murmur, Respiratory: normal respiratory effort, Bilateral Air entry present and no Crackles, no wheezes Abdomen: Bowel Sound present, Soft and no tenderness, no hernia Extremities: no Pedal edema, no calf tenderness Neurology: Awake and oriented x 0, nonverbal at baseline.  Unable to follow command.  Filed Weights   10/31/23 1400  Weight: 48.8 kg   Vitals:   11/01/23 1200 11/01/23 1215  BP: 102/75 104/76  Pulse: 72 74  Resp:  17  Temp:  98.2 F (36.8 C)  SpO2: 99% 100%    DISCHARGE MEDICATION: Allergies as of 11/01/2023       Reactions   Valproic Acid  Other (See Comments)   Unknown. Reaction not listed on MAR    Cephalexin Other (See Comments)   Unknown. Reaction not listed on MAR    Clarithromycin Other (See Comments)   Unknown. Reaction not listed on MAR    Macrolides And Ketolides Other (See Comments)   Unknown. Reaction not listed on MAR    Penicillins Other (See Comments)   Unknown. Reaction not listed on MAR         Medication List     TAKE these medications    acyclovir  400 MG tablet Commonly known as: ZOVIRAX  Take 400 mg by mouth 2 (two) times daily.   ANTACID ANTI-GAS PO Take 15-30 mLs by mouth See admin instructions. Take 15 ml < 100 lbs 30 for >100 lbs by mouth every 2 hours as needed for gas   Benecalorie Liqd Take 44 mLs by mouth at bedtime.   bisacodyl 10 MG suppository Commonly known as: DULCOLAX Place 10 mg rectally daily as needed for moderate constipation or mild constipation (if constipation is not relieved by MOM).   CAL-GEST ANTACID PO Take 1 tablet by mouth daily.   carbamazepine  100 MG chewable tablet Commonly known as: TEGRETOL  Chew 200-300 mg by mouth See admin instructions. Take 300 mg by mouth at 0800 and 2000. Take 200 mg by mouth  at 1100 and 1600.   Chest Congestion Relief 100 MG/5ML liquid Generic drug: guaiFENesin Take 300 mg by mouth every 4 (four) hours as needed for cough or to loosen phlegm.   cyanocobalamin  1000 MCG tablet Take 1 tablet (1,000 mcg total) by mouth daily. Start taking on: November 02, 2023   Daybue  200 MG/ML Soln Generic drug: trofinetide  Take 10,000 mg by mouth in the morning and at bedtime.   diphenhydrAMINE  25 MG tablet Commonly known as: BENADRYL  Take 25 mg by mouth every 4 (four) hours as needed for allergies (nasal congestion).   FLEET ENEMA RE Place 1 Dose rectally daily as needed (constipation not resolved by dulcolax supp).   levETIRAcetam  250 MG tablet Commonly known as: KEPPRA  Take 250 mg by mouth at bedtime.   levETIRAcetam  1000 MG tablet Commonly known as: KEPPRA  Take 1,000 mg by mouth 2 (two) times daily.   levOCARNitine 330 MG tablet  Commonly known as: CARNITOR Take 330 mg by mouth 2 (two) times daily.   levothyroxine  50 MCG tablet Commonly known as: SYNTHROID  Take 50 mcg by mouth daily.   loperamide 2 MG tablet Commonly known as: IMODIUM A-D Take 2-4 mg by mouth as needed for diarrhea or loose stools. Take 4 mg by mouth for the initial first dose. Then take 2 mg as needed for loose stools   megestrol  40 MG/ML suspension Commonly known as: MEGACE  Take 400 mg by mouth daily.   Milk of Magnesia 1200 MG/15ML suspension Generic drug: magnesium hydroxide Take 15-30 mLs by mouth daily as needed (no BM in 3 days). 15 ml < 100 lbs 30 ml > 100 lbs   pantoprazole  40 MG tablet Commonly known as: PROTONIX  Take 1 tablet (40 mg total) by mouth 2 (two) times daily. TWICE A DAY FOR 30 DAYS, THEN ONCE DAILY What changed:  when to take this additional instructions   phenytoin  50 MG tablet Commonly known as: DILANTIN  Chew 100 mg by mouth daily.   promethazine 25 MG tablet Commonly known as: PHENERGAN Take 25 mg by mouth every 4 (four) hours as needed for nausea or  vomiting.   VALTOCO  15 MG DOSE NA Place 15 mg into the nose as needed (seizures).   vitamin A & D ointment Apply 1 Application topically in the morning, at noon, and at bedtime.   Vitamin D 50 MCG (2000 UT) Caps Take 4,000 Units by mouth daily.       Allergies  Allergen Reactions   Valproic Acid  Other (See Comments)    Unknown. Reaction not listed on MAR    Cephalexin Other (See Comments)    Unknown. Reaction not listed on MAR    Clarithromycin Other (See Comments)    Unknown. Reaction not listed on MAR    Macrolides And Ketolides Other (See Comments)    Unknown. Reaction not listed on MAR    Penicillins Other (See Comments)    Unknown. Reaction not listed on [facility]    Discharge Instructions     Call MD for:   Complete by: As directed    Weakness or numbness   Call MD for:  difficulty breathing, headache or visual disturbances   Complete by: As directed    Call MD for:  extreme fatigue   Complete by: As directed    Call MD for:  persistant dizziness or light-headedness   Complete by: As directed    Call MD for:  persistant nausea and vomiting   Complete by: As directed    Call MD for:  severe uncontrolled pain   Complete by: As directed    Call MD for:  temperature >100.4   Complete by: As directed    Diet - low sodium heart healthy   Complete by: As directed    Discharge instructions   Complete by: As directed    Follow-up with PCP in 1 week, continue vitamin B12 and repeat B12 level after 3 months Check vitamin D level as an outpatient in 1 to 2 weeks   Increase activity slowly   Complete by: As directed        The results of significant diagnostics from this hospitalization (including imaging, microbiology, ancillary and laboratory) are listed below for reference.    Significant Diagnostic Studies: MR BRAIN WO CONTRAST Result Date: 11/01/2023 CLINICAL DATA:  Stroke follow-up EXAM: MRI HEAD WITHOUT CONTRAST TECHNIQUE: Multiplanar, multiecho pulse sequences  of the brain and surrounding structures were obtained without  intravenous contrast. COMPARISON:  None Available. FINDINGS: Brain: No acute infarct, mass effect or extra-axial collection. No acute or chronic hemorrhage. There is multifocal hyperintense T2-weighted signal within the white matter. Parenchymal volume and CSF spaces are normal. Old left basal ganglia infarct. The midline structures are normal. Vascular: Normal flow voids. Skull and upper cervical spine: Normal calvarium and skull base. Visualized upper cervical spine and soft tissues are normal. Sinuses/Orbits:Left mastoid effusion. Paranasal sinuses are clear. Normal orbits. IMPRESSION: 1. No acute intracranial abnormality. 2. 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 Electronically Signed   By: Juanetta Nordmann M.D.   On: 11/01/2023 01:20   US  PELVIS (TRANSABDOMINAL ONLY) Result Date: 10/31/2023 CLINICAL DATA:  Adnexal mass EXAM: TRANSABDOMINAL ULTRASOUND OF PELVIS TECHNIQUE: Transabdominal ultrasound examination of the pelvis was performed including evaluation of the uterus, ovaries, adnexal regions, and pelvic cul-de-sac. COMPARISON:  Ultrasound 10/01/2023, CT 09/28/2023 FINDINGS: Uterus Measurements: 6 x 1.8 x 3.4 cm = volume: 18 point stomach mL. No fibroids or other mass visualized. Endometrium Thickness: 3.2 mm.  No focal abnormality visualized. Right ovary Measurements: 1.6 x 0.9 x 1.3 cm = volume: 1.0 mL. Normal appearance/no adnexal mass. Left ovary Measurements: 1.9 x 1.1 x 1.7 cm = volume: 1.8 mL. Tubular appearing hypoechoic structure in the left adnexa, possibly representing dilated fallopian tube, poorly assessed due to trans abdominal technique. Other findings:  No abnormal free fluid. IMPRESSION: 1. Tubular appearing hypoechoic structure in the left adnexa, possibly representing dilated fallopian tube, poorly assessed due to trans abdominal technique. 2. Otherwise negative transabdominal pelvic  ultrasound. Electronically Signed   By: Esmeralda Hedge M.D.   On: 10/31/2023 23:05   DG Chest 1 View Result Date: 10/31/2023 CLINICAL DATA:  Leukocytosis EXAM: CHEST  1 VIEW COMPARISON:  09/28/2023 FINDINGS: The heart size and mediastinal contours are within normal limits. Both lungs are clear. Scoliosis of the spine. Rectangular opacity projects over the left upper mediastinal silhouette. IMPRESSION: No active disease. Rectangular opacity projects over the left upper mediastinal silhouette, question external artifact. Electronically Signed   By: Esmeralda Hedge M.D.   On: 10/31/2023 23:01   CT ANGIO HEAD NECK W WO CM Result Date: 10/31/2023 CLINICAL DATA:  Provided history: Stroke/TIA, determine embolic source. EXAM: CT ANGIOGRAPHY HEAD AND NECK WITH AND WITHOUT CONTRAST TECHNIQUE: Multidetector CT imaging of the head and neck was performed using the standard protocol during bolus administration of intravenous contrast. Multiplanar CT image reconstructions and MIPs were obtained to evaluate the vascular anatomy. Carotid stenosis measurements (when applicable) are obtained utilizing NASCET criteria, using the distal internal carotid diameter as the denominator. RADIATION DOSE REDUCTION: This exam was performed according to the departmental dose-optimization program which includes automated exposure control, adjustment of the mA and/or kV according to patient size and/or use of iterative reconstruction technique. CONTRAST:  75mL OMNIPAQUE  IOHEXOL  350 MG/ML SOLN COMPARISON:  Non-contrast head CT performed earlier today 10/31/2023. FINDINGS: CTA NECK FINDINGS Aortic arch: Standard aortic branching. The visualized thoracic aorta is normal in caliber. Streak/beam hardening artifact arising from a dense contrast bolus partially obscures the right subclavian artery. Within this limitation, there is no appreciable hemodynamically significant innominate or proximal subclavian artery stenosis. Right carotid system: CCA and  ICA patent within the neck without stenosis or significant atherosclerotic disease. No evidence of dissection. Left carotid system: CCA and ICA patent within the neck without stenosis or significant atherosclerotic disease. No evidence of dissection. Vertebral arteries: Vertebral arteries patent within the neck without stenosis  or significant atherosclerotic disease. No evidence of dissection. The right vertebral artery is dominant. Skeleton: Nonspecific reversal of the expected cervical lordosis. Cervical spondylosis. No acute fracture or aggressive osseous lesion. Other neck: No neck mass or cervical lymphadenopathy. Upper chest: No consolidation within the imaged lung apices. Review of the MIP images confirms the above findings CTA HEAD FINDINGS Anterior circulation: The intracranial internal carotid arteries are patent. Non-stenotic calcified plaque within both vessels. The M1 middle cerebral arteries are patent. No M2 proximal branch occlusion or high-Walton proximal stenosis. The anterior cerebral arteries are patent. No intracranial aneurysm is identified. Posterior circulation: The intracranial vertebral arteries are patent. The intracranial left vertebral artery is developmentally diminutive beyond the PICA origin. The basilar artery is patent. The posterior cerebral arteries are patent. Severe stenosis within the right PCA at the P3 segment level (series 16, image 20) (series 15, image 18). The right PCA is fetal in origin. A left posterior communicating artery is present. Venous sinuses: Assessment for dural venous sinus thrombosis is limited due to contrast timing. Anatomic variants: As described. Review of the MIP images confirms the above findings No emergent large vessel occlusion identified. This result and CTA head impression #2 communicated to Dr. Doretta Gant At 4:11 pmon 4/21/2025by text page via the [facility] messaging system. Please note, delayed transfer of some series to PACS delayed image  interpretation. IMPRESSION: CTA neck: The common carotid, internal carotid and vertebral arteries are patent within the neck without stenosis or significant atherosclerotic disease. No evidence of dissection. CTA head: 1. No proximal intracranial large vessel occlusion identified. 2. 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 3. Non-stenotic atherosclerotic plaque within the intracranial internal carotid arteries. Electronically Signed   By: Bascom Lily D.O.   On: 10/31/2023 16:13   CT HEAD CODE STROKE WO CONTRAST Result Date: 10/31/2023 CLINICAL DATA:  Provided history: Neuro deficit, acute, stroke suspected. Left-sided facial droop. EXAM: CT HEAD WITHOUT CONTRAST TECHNIQUE: Contiguous axial images were obtained from the base of the skull through the vertex without intravenous contrast. RADIATION DOSE REDUCTION: This exam was performed according to the departmental dose-optimization program which includes automated exposure control, adjustment of the mA and/or kV according to patient size and/or use of iterative reconstruction technique. COMPARISON:  Non-contrast head CT 07/31/2016. FINDINGS: Brain: Mild generalized cerebral atrophy. Lacunar infarcts within the left corona radiata and right lentiform nucleus, new from the prior head CT of 07/31/2016 and age-indeterminate. There is no acute intracranial hemorrhage. No demarcated cortical infarct. No extra-axial fluid collection. No evidence of an intracranial mass. No midline shift. Vascular: No hyperdense vessel.  Atherosclerotic calcifications. Skull: No calvarial fracture or aggressive osseous lesion. Sinuses/Orbits: No mass or acute finding within the imaged orbits. Minimal mucosal thickening within the right maxillary and left ethmoid sinuses. Other: Left mastoid effusion. ASPECTS ([place] Stroke Program Early CT Score) - Ganglionic level infarction (caudate, lentiform nuclei, internal capsule, insula, M1-M3 cortex):  6 - Supraganglionic infarction (M4-M6 cortex): 3 Total score (0-10 with 10 being normal): 9 (due to the age-indeterminate lacunar infarct within the right lentiform nucleus). Results in impression #1 communicated to Dr. Doretta Gant at 3:16 pmon 4/21/2025by text page via the [facility] messaging system. IMPRESSION: 1. Lacunar infarcts within the right lentiform nucleus and left corona radiata, new from the prior head CT of 07/31/2016 and age-indeterminate. Consider a brain MRI for further evaluation. 2. Mild generalized cerebral atrophy. Electronically Signed   By: Bascom Lily D.O.   On: 10/31/2023 15:18  Microbiology: No results found for this or any previous visit (from the past 240 hours).   Labs: CBC: Recent Labs  Lab 10/31/23 1445 10/31/23 1446 11/01/23 0819  WBC 16.6*  --  6.4  NEUTROABS 13.4*  --  3.8  HGB 14.2 15.0 13.6  HCT 41.9 44.0 42.0  MCV 101.5*  --  105.0*  PLT 271  --  230   Basic Metabolic Panel: Recent Labs  Lab 10/31/23 1445 10/31/23 1446 11/01/23 0819  NA 140 141 142  K 4.0 3.9 3.7  CL 107 106 111  CO2 24  --  21*  GLUCOSE 127* 122* 103*  BUN 7 7 <5*  CREATININE 0.63 0.60 0.58  CALCIUM 9.1  --  8.7*  MG  --   --  2.0  PHOS  --   --  3.4   Liver Function Tests: Recent Labs  Lab 10/31/23 1445  AST 24  ALT 17  ALKPHOS 85  BILITOT 0.7  PROT 6.6  ALBUMIN  3.8   No results for input(s): "LIPASE", "AMYLASE" in the last 168 hours. No results for input(s): "AMMONIA" in the last 168 hours. Cardiac Enzymes: No results for input(s): "CKTOTAL", "CKMB", "CKMBINDEX", "TROPONINI" in the last 168 hours. BNP (last 3 results) No results for input(s): "BNP" in the last 8760 hours. CBG: Recent Labs  Lab 10/31/23 1440  GLUCAP 128*    Time spent: 35 minutes  Signed:  Althia Atlas  [facility]  11/01/2023 1:28 PM

## 2023-11-01 NOTE — Evaluation (Signed)
 Physical Therapy Evaluation Patient Details Name: ANAGHA LOSEKE MRN: [id number] DOB: 1971/06/24 Today's Date: 11/01/2023  History of Present Illness  Patient is a 53 y/o female seen in ED on 10/31/23 with L facial droop and question L side weakness.  She is non-verbal at baseline and walks with assistance and is dependent for all her BADL's living in a group home.  She was admitted recently to [facility] for esophageal blockage.  MRI was negative for acute abnormality.  PMH positive for Rett's syndrome, mental retardation, hypothyroidism, HSV-type 1.  Clinical Impression  Patient presents close to functional baseline.  She was able to ambulate in hallway with min to mod A for balance and turns.  She was in bed x 24 hours during admission/testing so mother reports not quite at 100% of baseline, but understandable due to bedrest.  She is stable for d/c to group home from mobility standpoint.  No further skilled PT needs at this time.         If plan is discharge home, recommend the following: A lot of help with bathing/dressing/bathroom;A little help with walking and/or transfers   Can travel by private vehicle        Equipment Recommendations None recommended by PT  Recommendations for Other Services       Functional Status Assessment Patient has not had a recent decline in their functional status     Precautions / Restrictions Precautions Precautions: Fall      Mobility  Bed Mobility Overal bed mobility: Needs Assistance Bed Mobility: Supine to Sit, Sit to Supine     Supine to sit: Mod assist Sit to supine: Mod assist   General bed mobility comments: assist for legs off bed and back into bed    Transfers Overall transfer level: Needs assistance Equipment used: 1 person hand held assist Transfers: Sit to/from Stand Sit to Stand: Min assist           General transfer comment: for balance up prior to environmental set up    Ambulation/Gait Ambulation/Gait assistance:  Min assist, Mod assist Gait Distance (Feet): 80 Feet Assistive device: None, 1 person hand held assist Gait Pattern/deviations: Step-through pattern, Decreased stride length, Leaning posteriorly       General Gait Details: mild posterior lean, assist for initiation, balance and direction, increased help to turn around  Stairs            Wheelchair Mobility     Tilt Bed    Modified Rankin (Stroke Patients Only) Modified Rankin (Stroke Patients Only) Pre-Morbid Rankin Score: Moderately severe disability Modified Rankin: Moderately severe disability     Balance Overall balance assessment: Needs assistance   Sitting balance-Leahy Scale: Poor Sitting balance - Comments: assist for safety sitting unsupported     Standing balance-Leahy Scale: Poor Standing balance comment: assist for balance                             Pertinent Vitals/Pain Pain Assessment Pain Assessment: Faces Faces Pain Scale: No hurt    Home Living Family/patient expects to be discharged to:: Group home                        Prior Function Prior Level of Function : Needs assist             Mobility Comments: ambulatory for short distances with assistance in the home; can step up onto a step, but per  her mother tends to lean back into support from behind. ADLs Comments: mother reports no purposeful use of her hands     Extremity/Trunk Assessment   Upper Extremity Assessment Upper Extremity Assessment: Difficult to assess due to impaired cognition (wrings hands together, does not reach to help to move herself or support herself)    Lower Extremity Assessment Lower Extremity Assessment: Difficult to assess due to impaired cognition (moves legs and ambulatory with assistance)       Communication   Communication Communication: Impaired Factors Affecting Communication: Other (comment) (non-verbal)    Cognition Arousal: Alert Behavior During Therapy: Flat  affect   PT - Cognitive impairments: History of cognitive impairments                         Following commands: Impaired Following commands impaired:  (does not follow commands)     Cueing       General Comments General comments (skin integrity, edema, etc.): mother in the room and supportive, BP stable in sitting, had one reading in supine 74/46    Exercises     Assessment/Plan    PT Assessment Patient does not need any further PT services  PT Problem List         PT Treatment Interventions      PT Goals (Current goals can be found in the Care Plan section)  Acute Rehab PT Goals PT Goal Formulation: All assessment and education complete, DC therapy    Frequency       Co-evaluation               AM-PAC PT "6 Clicks" Mobility  Outcome Measure Help needed turning from your back to your side while in a flat bed without using bedrails?: A Little Help needed moving from lying on your back to sitting on the side of a flat bed without using bedrails?: A Lot Help needed moving to and from a bed to a chair (including a wheelchair)?: A Lot Help needed standing up from a chair using your arms (e.g., wheelchair or bedside chair)?: A Lot Help needed to walk in hospital room?: A Little Help needed climbing 3-5 steps with a railing? : Total 6 Click Score: 13    End of Session Equipment Utilized During Treatment: Gait belt Activity Tolerance: Patient tolerated treatment well Patient left: in bed;with family/visitor present   PT Visit Diagnosis: Unsteadiness on feet (R26.81)    Time: 1610-9604 PT Time Calculation (min) (ACUTE ONLY): 31 min   Charges:   PT Evaluation $PT Eval Moderate Complexity: 1 Mod PT Treatments $Gait Training: 8-22 mins PT General Charges $$ ACUTE PT VISIT: 1 Visit         Abigail Hoff, PT Acute Rehabilitation Services [phone] 11/01/2023   Marley Simmers 11/01/2023, 2:15 PM

## 2023-11-01 NOTE — Care Management Obs Status (Signed)
 MEDICARE OBSERVATION STATUS NOTIFICATION   Patient Details  Name: Megan Walton MRN: [id number] Date of Birth: 1970-11-06   Medicare Observation Status Notification Given:  Yes    Joanette Moynahan, RN 11/01/2023, 1:41 PM

## 2023-11-01 NOTE — Progress Notes (Addendum)
 STROKE TEAM PROGRESS NOTE    SIGNIFICANT HOSPITAL EVENTS  Presented from group home with acute onset of left facial droop and questionable left-sided weakness. MRI shows no acute abnormality with chronic left infarct.  INTERIM HISTORY/SUBJECTIVE  Mother at bedside.  RN at bedside.   Mild left nasolabial flattening seen on face, with no other changes from her baseline assessment.  Mother states patient has been on seizure medications for many years, has not had a seizure in over 3 years. Tegretol  level is low, recommend reviewing medication administration and compliance importance with patient's group home.  Assessment plan of care discussed thoroughly with mother at bedside.  OBJECTIVE  CBC    Component Value Date/Time   WBC 6.4 11/01/2023 0819   RBC 4.00 11/01/2023 0819   HGB 13.6 11/01/2023 0819   HCT 42.0 11/01/2023 0819   PLT 230 11/01/2023 0819   MCV 105.0 (H) 11/01/2023 0819   MCH 34.0 11/01/2023 0819   MCHC 32.4 11/01/2023 0819   RDW 12.7 11/01/2023 0819   LYMPHSABS 1.8 11/01/2023 0819   MONOABS 0.6 11/01/2023 0819   EOSABS 0.2 11/01/2023 0819   BASOSABS 0.0 11/01/2023 0819    BMET    Component Value Date/Time   NA 142 11/01/2023 0819   K 3.7 11/01/2023 0819   CL 111 11/01/2023 0819   CO2 21 (L) 11/01/2023 0819   GLUCOSE 103 (H) 11/01/2023 0819   BUN <5 (L) 11/01/2023 0819   CREATININE 0.58 11/01/2023 0819   CALCIUM 8.7 (L) 11/01/2023 0819   GFRNONAA >60 11/01/2023 0819    IMAGING past 24 hours MR BRAIN WO CONTRAST Result Date: 11/01/2023 CLINICAL DATA:  Stroke follow-up EXAM: MRI HEAD WITHOUT CONTRAST TECHNIQUE: Multiplanar, multiecho pulse sequences of the brain and surrounding structures were obtained without intravenous contrast. COMPARISON:  None Available. FINDINGS: Brain: No acute infarct, mass effect or extra-axial collection. No acute or chronic hemorrhage. There is multifocal hyperintense T2-weighted signal within the white matter. Parenchymal  volume and CSF spaces are normal. Old left basal ganglia infarct. The midline structures are normal. Vascular: Normal flow voids. Skull and upper cervical spine: Normal calvarium and skull base. Visualized upper cervical spine and soft tissues are normal. Sinuses/Orbits:Left mastoid effusion. Paranasal sinuses are clear. Normal orbits. IMPRESSION: 1. No acute intracranial abnormality. 2. 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 Electronically Signed   By: Juanetta Nordmann M.D.   On: 11/01/2023 01:20   US  PELVIS (TRANSABDOMINAL ONLY) Result Date: 10/31/2023 CLINICAL DATA:  Adnexal mass EXAM: TRANSABDOMINAL ULTRASOUND OF PELVIS TECHNIQUE: Transabdominal ultrasound examination of the pelvis was performed including evaluation of the uterus, ovaries, adnexal regions, and pelvic cul-de-sac. COMPARISON:  Ultrasound 10/01/2023, CT 09/28/2023 FINDINGS: Uterus Measurements: 6 x 1.8 x 3.4 cm = volume: 18 point stomach mL. No fibroids or other mass visualized. Endometrium Thickness: 3.2 mm.  No focal abnormality visualized. Right ovary Measurements: 1.6 x 0.9 x 1.3 cm = volume: 1.0 mL. Normal appearance/no adnexal mass. Left ovary Measurements: 1.9 x 1.1 x 1.7 cm = volume: 1.8 mL. Tubular appearing hypoechoic structure in the left adnexa, possibly representing dilated fallopian tube, poorly assessed due to trans abdominal technique. Other findings:  No abnormal free fluid. IMPRESSION: 1. Tubular appearing hypoechoic structure in the left adnexa, possibly representing dilated fallopian tube, poorly assessed due to trans abdominal technique. 2. Otherwise negative transabdominal pelvic ultrasound. Electronically Signed   By: Esmeralda Hedge M.D.   On: 10/31/2023 23:05   DG Chest 1 View Result  Date: 10/31/2023 CLINICAL DATA:  Leukocytosis EXAM: CHEST  1 VIEW COMPARISON:  09/28/2023 FINDINGS: The heart size and mediastinal contours are within normal limits. Both lungs are clear. Scoliosis of the  spine. Rectangular opacity projects over the left upper mediastinal silhouette. IMPRESSION: No active disease. Rectangular opacity projects over the left upper mediastinal silhouette, question external artifact. Electronically Signed   By: Esmeralda Hedge M.D.   On: 10/31/2023 23:01   CT ANGIO HEAD NECK W WO CM Result Date: 10/31/2023 CLINICAL DATA:  Provided history: Stroke/TIA, determine embolic source. EXAM: CT ANGIOGRAPHY HEAD AND NECK WITH AND WITHOUT CONTRAST TECHNIQUE: Multidetector CT imaging of the head and neck was performed using the standard protocol during bolus administration of intravenous contrast. Multiplanar CT image reconstructions and MIPs were obtained to evaluate the vascular anatomy. Carotid stenosis measurements (when applicable) are obtained utilizing NASCET criteria, using the distal internal carotid diameter as the denominator. RADIATION DOSE REDUCTION: This exam was performed according to the departmental dose-optimization program which includes automated exposure control, adjustment of the mA and/or kV according to patient size and/or use of iterative reconstruction technique. CONTRAST:  75mL OMNIPAQUE  IOHEXOL  350 MG/ML SOLN COMPARISON:  Non-contrast head CT performed earlier today 10/31/2023. FINDINGS: CTA NECK FINDINGS Aortic arch: Standard aortic branching. The visualized thoracic aorta is normal in caliber. Streak/beam hardening artifact arising from a dense contrast bolus partially obscures the right subclavian artery. Within this limitation, there is no appreciable hemodynamically significant innominate or proximal subclavian artery stenosis. Right carotid system: CCA and ICA patent within the neck without stenosis or significant atherosclerotic disease. No evidence of dissection. Left carotid system: CCA and ICA patent within the neck without stenosis or significant atherosclerotic disease. No evidence of dissection. Vertebral arteries: Vertebral arteries patent within the neck  without stenosis or significant atherosclerotic disease. No evidence of dissection. The right vertebral artery is dominant. Skeleton: Nonspecific reversal of the expected cervical lordosis. Cervical spondylosis. No acute fracture or aggressive osseous lesion. Other neck: No neck mass or cervical lymphadenopathy. Upper chest: No consolidation within the imaged lung apices. Review of the MIP images confirms the above findings CTA HEAD FINDINGS Anterior circulation: The intracranial internal carotid arteries are patent. Non-stenotic calcified plaque within both vessels. The M1 middle cerebral arteries are patent. No M2 proximal branch occlusion or high-grade proximal stenosis. The anterior cerebral arteries are patent. No intracranial aneurysm is identified. Posterior circulation: The intracranial vertebral arteries are patent. The intracranial left vertebral artery is developmentally diminutive beyond the PICA origin. The basilar artery is patent. The posterior cerebral arteries are patent. Severe stenosis within the right PCA at the P3 segment level (series 16, image 20) (series 15, image 18). The right PCA is fetal in origin. A left posterior communicating artery is present. Venous sinuses: Assessment for dural venous sinus thrombosis is limited due to contrast timing. Anatomic variants: As described. Review of the MIP images confirms the above findings No emergent large vessel occlusion identified. This result and CTA head impression #2 communicated to Dr. Doretta Gant At 4:11 pmon 4/21/2025by text page via the [facility] messaging system. Please note, delayed transfer of some series to PACS delayed image interpretation. IMPRESSION: CTA neck: The common carotid, internal carotid and vertebral arteries are patent within the neck without stenosis or significant atherosclerotic disease. No evidence of dissection. CTA head: 1. No proximal intracranial large vessel occlusion identified. 2. Severe stenosis within the right posterior  cerebral artery at the P3 segment level. 3. Non-stenotic atherosclerotic plaque within the intracranial internal carotid arteries.  Electronically Signed   By: Bascom Lily D.O.   On: 10/31/2023 16:13   CT HEAD CODE STROKE WO CONTRAST Result Date: 10/31/2023 CLINICAL DATA:  Provided history: Neuro deficit, acute, stroke suspected. Left-sided facial droop. EXAM: CT HEAD WITHOUT CONTRAST TECHNIQUE: Contiguous axial images were obtained from the base of the skull through the vertex without intravenous contrast. RADIATION DOSE REDUCTION: This exam was performed according to the departmental dose-optimization program which includes automated exposure control, adjustment of the mA and/or kV according to patient size and/or use of iterative reconstruction technique. COMPARISON:  Non-contrast head CT 07/31/2016. FINDINGS: Brain: Mild generalized cerebral atrophy. Lacunar infarcts within the left corona radiata and right lentiform nucleus, new from the prior head CT of 07/31/2016 and age-indeterminate. There is no acute intracranial hemorrhage. No demarcated cortical infarct. No extra-axial fluid collection. No evidence of an intracranial mass. No midline shift. Vascular: No hyperdense vessel.  Atherosclerotic calcifications. Skull: No calvarial fracture or aggressive osseous lesion. Sinuses/Orbits: No mass or acute finding within the imaged orbits. Minimal mucosal thickening within the right maxillary and left ethmoid sinuses. Other: Left mastoid effusion. ASPECTS ([place]ta Stroke Program Early CT Score) - Ganglionic level infarction (caudate, lentiform nuclei, internal capsule, insula, M1-M3 cortex): 6 - Supraganglionic infarction (M4-M6 cortex): 3 Total score (0-10 with 10 being normal): 9 (due to the age-indeterminate lacunar infarct within the right lentiform nucleus). Results in impression #1 communicated to Dr. Doretta Gant at 3:16 pmon 4/21/2025by text page via the [facility] messaging system. IMPRESSION: 1. Lacunar infarcts  within the right lentiform nucleus and left corona radiata, new from the prior head CT of 07/31/2016 and age-indeterminate. Consider a brain MRI for further evaluation. 2. Mild generalized cerebral atrophy. Electronically Signed   By: Bascom Lily D.O.   On: 10/31/2023 15:18    Vitals:   11/01/23 1015 11/01/23 1030 11/01/23 1100 11/01/23 1200  BP: 104/74 98/72 94/65  102/75  Pulse: 72 78 71 72  Resp: 16 16    Temp:      TempSrc:      SpO2: 100% 98% 100% 99%  Weight:         PHYSICAL EXAM General:  Alert, chronically ill looking frail middle aged lady  in no acute distress CV: Regular rate and rhythm on monitor Respiratory:  Regular, unlabored respirations on room air  NEURO:  Patient has developmental delay, mental retardation, nonverbal at baseline secondary to history of Rett's syndrome. Severe wasting of muscles all over. Mental Status: Awake and alert with intermittent tracking of examiner. Speech/Language: Patient is nonverbal at baseline.  She does not follow commands at baseline  Cranial Nerves:  II: PERRL.  III, IV, VI: EOMI VII: Left nasolabial fold flattening VIII: hearing intact to voice.  XII: Uncooperative with tongue protrusion Motor:  BUE and contracted state (at baseline due to history of Rett syndrome) but with movement to antigravity strength BLE: Spontaneous movement with antigravity strength Increased tone, reduced bulk Sensation-appears to be intact throughout Coordination: Unable to assess Gait- deferred  Most Recent NIH 11     ASSESSMENT/PLAN  Ms. Megan Walton is a  53 y.o. female with a history of Rett syndrome, seizures and constipation who presents with sudden onset left facial droop and questionable left-sided weakness.  Patient lives in a group home and at baseline is nonverbal and can walk with assistance.  On exam, noted to have left nasolabial fold flattening but no other focal deficits.  CT head reveals age-indeterminate lacunar  infarcts within right lentiform nucleus and  left corona radiata.   NIH on Admission: 13.  Stroke-like episode versus seizure-like episode  Code Stroke CT head  Left corona radiata and right lentiform nucleus lacunar infarcts new since prior head CT of 2018, age-indeterminate. Mild generalized cerebral atrophy CTA head & neck  No LVO Severe stenosis right PCA at P3 MRI   No acute intracranial abnormality Old left basal ganglia infarct Chronic small vessel ischemia  2D Echo: PENDING LDL 94 HgbA1c 4.2 VTE prophylaxis - Heparin  SQ No antithrombotic prior to admission, continue on No antithrombotic as no evidence of stroke seen, no medication changes recommended Therapy recommendations:  Pending Disposition:  likely discharge to her group home today vs tomorrow  Epilepsy Last seizure >3 years ago per mother at bedside Home medications, continue Keppra  1000 mg twice daily, 250 mg at bedtime Diazepam  15 mg intranasal as needed Phenytoin  50 mg daily Tegretol  level is low at 3.2 Recommend reviewing medication administration and AED compliance importance with staff at patient's group home  Hypertension Home meds:  none Stable BP goal: Permissive hypertension for 24-48 hours after symptom onset, then gradually normalize.   Hyperlipidemia Home meds:  none LDL 94, goal < 70 Add Lipitor 40mg   Continue statin at discharge  Diabetes type II, no history Home meds:  none HgbA1c 4.2, goal < 7.0 CBGs SSI  Other Active Problems Rett's Syndrome Mental Retardation  HSV-type 1  Hypothyroidism  Hospital day # 1   Pt seen by Neuro NP/APP and later by MD. Note/plan to be edited by MD as needed.    Audrene Lease, DNP, AGACNP-BC [facility] Please use AMION for contact information & EPIC for messaging.  I have personally obtained history,examined this patient, reviewed notes, independently viewed imaging studies, participated in medical decision making and plan of  care.ROS completed by me personally and pertinent positives fully documented  I have made any additions or clarifications directly to the above note. Agree with note above.  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  Neurological exam at present is back to her baseline as per mother.  MRI is negative for stroke.  Recommend continue home doses of anticonvulsants and transferred back to group home.  No need for antiplatelet therapy or aggressive stroke restratification at this time.  Greater than 50% time during this 50-minute visit was spent on counseling and coordination of care and discussion with patient , her mother and care team as mother wants and questions.  Ardella Beaver, MD Medical Director [facility] Pager: [phone] 11/01/2023 3:38 PM   To contact Stroke Continuity provider, please refer to [email or web address]. After hours, contact General Neurology

## 2023-11-01 NOTE — Progress Notes (Signed)
 OT Cancellation Note  Patient Details Name: Megan Walton MRN: [id number] DOB: 1970/10/25   Cancelled Treatment:    Reason Eval/Treat Not Completed: PT screened, no needs identified, will sign off.  Per discussion with pt's mom she is total assist at baseline for all selfcare, feeding, and toileting.  She does not follow any one step commands but was able to stand for assist caregivers with toileting and ambulated prior to admission with use of a gait belt and min assist within her group home.  Will sign off for OT at this time and defer mobility to PT as this was the only thing per report she was able to assist with prior to admission.      Katelyn Broadnax OTR/L 11/01/2023, 1:29 PM

## 2023-11-01 NOTE — Progress Notes (Signed)
 Transition of Care [facility]) - Inpatient Brief Assessment   Patient Details  Name: Megan Walton MRN: [id number] Date of Birth: [birth year]  [facility] CM/SW Contact:    Joanette Moynahan, RN Phone Number: 11/01/2023, 1:56 PM   Clinical Narrative: Presented from group home with acute onset of left facial droop and questionable left-sided weakness. MRI shows no acute abnormality with chronic left infarct.  Pt to return to group home and resume care with caregivers in place.   Transition of Care Asessment: Insurance and Status: Insurance coverage has been reviewed Patient has primary care physician: Yes Home environment has been reviewed: (P) group home with caregivers Prior level of function:: total assistance on ADLs Prior/Current Home Services: No current home services Social Drivers of Health Review: SDOH reviewed no interventions necessary Readmission risk has been reviewed: Yes Transition of care needs: no transition of care needs at this time

## 2024-02-23 ENCOUNTER — Other Ambulatory Visit (HOSPITAL_COMMUNITY): Payer: Self-pay

## 2024-02-23 ENCOUNTER — Encounter: Admitting: Obstetrics and Gynecology

## 2024-03-27 ENCOUNTER — Ambulatory Visit: Admitting: Obstetrics and Gynecology

## 2024-03-27 ENCOUNTER — Ambulatory Visit: Payer: Self-pay | Admitting: Obstetrics and Gynecology

## 2024-03-27 ENCOUNTER — Other Ambulatory Visit (HOSPITAL_COMMUNITY)
Admission: RE | Admit: 2024-03-27 | Discharge: 2024-03-27 | Disposition: A | Discharge status: Home / Self Care | Source: Ambulatory Visit | Attending: Obstetrics and Gynecology | Admitting: Obstetrics and Gynecology

## 2024-03-27 ENCOUNTER — Encounter: Payer: Self-pay | Admitting: Obstetrics and Gynecology

## 2024-03-27 VITALS — BP 102/64 | HR 35 | Wt 110.0 lb

## 2024-03-27 DIAGNOSIS — Z1331 Encounter for screening for depression: Secondary | ICD-10-CM

## 2024-03-27 DIAGNOSIS — Z01419 Encounter for gynecological examination (general) (routine) without abnormal findings: Secondary | ICD-10-CM

## 2024-03-27 DIAGNOSIS — R296 Repeated falls: Secondary | ICD-10-CM

## 2024-03-27 DIAGNOSIS — Z124 Encounter for screening for malignant neoplasm of cervix: Secondary | ICD-10-CM | POA: Diagnosis not present

## 2024-03-27 DIAGNOSIS — Z9189 Other specified personal risk factors, not elsewhere classified: Secondary | ICD-10-CM

## 2024-03-27 DIAGNOSIS — B009 Herpesviral infection, unspecified: Secondary | ICD-10-CM

## 2024-03-27 DIAGNOSIS — E2839 Other primary ovarian failure: Secondary | ICD-10-CM

## 2024-03-27 DIAGNOSIS — Z1231 Encounter for screening mammogram for malignant neoplasm of breast: Secondary | ICD-10-CM

## 2024-03-27 DIAGNOSIS — M8000XA Age-related osteoporosis with current pathological fracture, unspecified site, initial encounter for fracture: Secondary | ICD-10-CM

## 2024-03-27 DIAGNOSIS — N952 Postmenopausal atrophic vaginitis: Secondary | ICD-10-CM

## 2024-03-27 DIAGNOSIS — N898 Other specified noninflammatory disorders of vagina: Secondary | ICD-10-CM

## 2024-03-27 LAB — WET PREP FOR TRICH, YEAST, CLUE

## 2024-03-27 NOTE — Progress Notes (Signed)
 53 y.o. y.o. female here for medicare gyn annual exam. She is here today with her mother and care taker. Patient is non communicative and in a wheel chair. Mother reports patient has been in menopause for several years. She denies any PMB She is living in a home and is not sexually active. She has never had a pap smear The home has sent her today for an annual gyn exam. Mother is fine with proceeding with a pelvic exam and will notify us  if she would like to stop at any time.  MMG: states recently Dxa: ?date osteopenia. History of foot fracture. Does report frequent falls. No current bone support medication  Does report patient had to go to the ER and CT with  possible 2.4cm left ovary vs. Adnexal mass. Recent ca125 normal with PCP  No LMP recorded. Patient is perimenopausal.      03/27/2024   10:49 AM  Depression screen PHQ 2/9  Decreased Interest 0  Down, Depressed, Hopeless 0  PHQ - 2 Score 0    US  PELVIS (TRANSABDOMINAL ONLY) (Accession [id number]) (Order [id number]) Imaging Date: 10/31/2023 Department: [facility] Released By: Dionisio Lauraine FERNS, RDMS Authorizing: Tobie Mario GAILS, MD   Exam Status  Status  Final [99]   PACS Intelerad Image Link   Show images for US  PELVIS (TRANSABDOMINAL ONLY) Study Result  Narrative & Impression  CLINICAL DATA:  Adnexal mass   EXAM: TRANSABDOMINAL ULTRASOUND OF PELVIS   TECHNIQUE: Transabdominal ultrasound examination of the pelvis was performed including evaluation of the uterus, ovaries, adnexal regions, and pelvic cul-de-sac.   COMPARISON:  Ultrasound 10/01/2023, CT 09/28/2023   FINDINGS: Uterus   Measurements: 6 x 1.8 x 3.4 cm = volume: 18 point stomach mL. No fibroids or other mass visualized.   Endometrium   Thickness: 3.2 mm.  No focal abnormality visualized.   Right ovary   Measurements: 1.6 x 0.9 x 1.3 cm = volume: 1.0 mL. Normal appearance/no adnexal mass.    Left ovary   Measurements: 1.9 x 1.1 x 1.7 cm = volume: 1.8 mL. Tubular appearing hypoechoic structure in the left adnexa, possibly representing dilated fallopian tube, poorly assessed due to trans abdominal technique.   Other findings:  No abnormal free fluid.   IMPRESSION: 1. Tubular appearing hypoechoic structure in the left adnexa, possibly representing dilated fallopian tube, poorly assessed due to trans abdominal technique. 2. Otherwise negative transabdominal pelvic ultrasound.     Electronically Signed   By: Luke Bun M.D.   On: 10/31/2023 23:05     Body mass index is 22.22 kg/m.     03/27/2024   10:49 AM  Depression screen PHQ 2/9  Decreased Interest 0  Down, Depressed, Hopeless 0  PHQ - 2 Score 0    Blood pressure 102/64, pulse (!) 35, weight 110 lb (49.9 kg), SpO2 (!) 71%.  No results found for: DIAGPAP, HPVHIGH, ADEQPAP  GYN HISTORY: No results found for: DIAGPAP, HPVHIGH, ADEQPAP  OB History  Gravida Para Term Preterm AB Living  0       SAB IAB Ectopic Multiple Live Births          Past Medical History:  Diagnosis Date   Constipation, chronic    Developmental delay    Foot fracture    Herpes simplex type 1 antibody positive    Hypothyroidism    Mental retardation    Mental retardation    MR (mental retardation)    profound  Osteopenia    Right Hip   Rett syndrome    Rett's syndrome    Rett's syndrome    Seizures (HCC)    Thyroid  disease     Past Surgical History:  Procedure Laterality Date   ESOPHAGOGASTRODUODENOSCOPY N/A 09/30/2023   Procedure: EGD (ESOPHAGOGASTRODUODENOSCOPY);  Surgeon: Rollin Dover, MD;  Location: [facility];  Service: Gastroenterology;  Laterality: N/A;   FOREIGN BODY REMOVAL  09/30/2023   Procedure: REMOVAL, FOREIGN BODY;  Surgeon: Rollin Dover, MD;  Location: [facility];  Service: Gastroenterology;;    Current Outpatient Medications on File Prior to Visit  Medication Sig Dispense  Refill   acyclovir  (ZOVIRAX ) 400 MG tablet Take 400 mg by mouth 2 (two) times daily.     Alum & Mag Hydroxide-Simeth (ANTACID ANTI-GAS PO) Take 15-30 mLs by mouth See admin instructions. Take 15 ml < 100 lbs 30 for >100 lbs by mouth every 2 hours as needed for gas     bisacodyl (DULCOLAX) 10 MG suppository Place 10 mg rectally daily as needed for moderate constipation or mild constipation (if constipation is not relieved by MOM).     Calcium Carbonate Antacid (CAL-GEST ANTACID PO) Take 1 tablet by mouth daily.     carbamazepine  (TEGRETOL ) 100 MG chewable tablet Chew 200-300 mg by mouth See admin instructions. Take 300 mg by mouth at 0800 and 2000. Take 200 mg by mouth at 1100 and 1600.     Cholecalciferol (VITAMIN D) 50 MCG (2000 UT) CAPS Take 4,000 Units by mouth daily.     diazePAM  (VALTOCO  15 MG DOSE NA) Place 15 mg into the nose as needed (seizures).     diphenhydrAMINE  (BENADRYL ) 25 MG tablet Take 25 mg by mouth every 4 (four) hours as needed for allergies (nasal congestion).     guaiFENesin (CHEST CONGESTION RELIEF) 100 MG/5ML liquid Take 300 mg by mouth every 4 (four) hours as needed for cough or to loosen phlegm.     levETIRAcetam  (KEPPRA ) 1000 MG tablet Take 1,000 mg by mouth 2 (two) times daily.     levETIRAcetam  (KEPPRA ) 250 MG tablet Take 250 mg by mouth at bedtime.     levOCARNitine (CARNITOR) 330 MG tablet Take 330 mg by mouth 2 (two) times daily.     levothyroxine  (SYNTHROID , LEVOTHROID) 50 MCG tablet Take 50 mcg by mouth daily.     loperamide (IMODIUM A-D) 2 MG tablet Take 2-4 mg by mouth as needed for diarrhea or loose stools. Take 4 mg by mouth for the initial first dose. Then take 2 mg as needed for loose stools     magnesium hydroxide (MILK OF MAGNESIA) 400 MG/5ML suspension Take 15-30 mLs by mouth daily as needed (no BM in 3 days). 15 ml < 100 lbs 30 ml > 100 lbs     megestrol  (MEGACE ) 40 MG/ML suspension Take 400 mg by mouth daily.     Nutritional Supplements (BENECALORIE) LIQD  Take 44 mLs by mouth at bedtime.     pantoprazole  (PROTONIX ) 40 MG tablet Take 1 tablet (40 mg total) by mouth 2 (two) times daily. TWICE A DAY FOR 30 DAYS, THEN ONCE DAILY (Patient taking differently: Take 40 mg by mouth daily.) 60 tablet 0   phenytoin  (DILANTIN ) 50 MG tablet Chew 100 mg by mouth daily.     promethazine (PHENERGAN) 25 MG tablet Take 25 mg by mouth every 4 (four) hours as needed for nausea or vomiting.     Sodium Phosphates (FLEET ENEMA RE) Place 1 Dose rectally daily  as needed (constipation not resolved by dulcolax supp).     trofinetide  (DAYBUE ) 200 MG/ML SOLN Take 10,000 mg by mouth in the morning and at bedtime.     Vitamins A & D (VITAMIN A & D) ointment Apply 1 Application topically in the morning, at noon, and at bedtime.     No current facility-administered medications on file prior to visit.    Social History   Socioeconomic History   Marital status: Single    Spouse name: Not on file   Number of children: Not on file   Years of education: Not on file   Highest education level: Not on file  Occupational History   Not on file  Tobacco Use   Smoking status: Never   Smokeless tobacco: Never  Vaping Use   Vaping status: Never Used  Substance and Sexual Activity   Alcohol use: No   Drug use: No   Sexual activity: Never  Other Topics Concern   Not on file  Social History Narrative   Not on file   Social Drivers of Health   Financial Resource Strain: Not on file  Food Insecurity: Not on file  Transportation Needs: Not on file  Physical Activity: Not on file  Stress: Not on file  Social Connections: Unknown (07/16/2022)   Received from [facility]   Social Network    Social Network: Not on file  Intimate Partner Violence: Unknown (07/16/2022)   Received from [facility]   HITS    Physically Hurt: Not on file    Insult or Talk Down To: Not on file    Threaten Physical Harm: Not on file    Scream or Curse: Not on file    Family History  Problem  Relation Age of Onset   Lymphoma Father    Healthy Mother    Healthy Sister    ADD / ADHD Brother    Healthy Brother      Allergies  Allergen Reactions   Valproic Acid  Other (See Comments)    Unknown. Reaction not listed on MAR    Cephalexin Other (See Comments)    Unknown. Reaction not listed on MAR    Clarithromycin Other (See Comments)    Unknown. Reaction not listed on MAR    Macrolides And Ketolides Other (See Comments)    Unknown. Reaction not listed on MAR    Penicillins Other (See Comments)    Unknown. Reaction not listed on MAR       Patient's last menstrual period was No LMP recorded. Patient is perimenopausal..            Review of Systems Alls systems reviewed and are negative.     Physical Exam Constitutional:      Appearance: Normal appearance.  Genitourinary:     Vulva and urethral meatus normal.     No lesions in the vagina.     Right Labia: No rash, lesions or skin changes.    Left Labia: No lesions, skin changes or rash.    No vaginal discharge or tenderness.     No vaginal prolapse present.    Moderate vaginal atrophy present.     Right Adnexa: not tender, not palpable and no mass present.    Left Adnexa: not tender, not palpable and no mass present.    No cervical motion tenderness or discharge.     Uterus is not enlarged, tender or irregular.  Breasts:    Right: Normal.     Left: Normal.  HENT:     Head: Normocephalic.  Neck:     Thyroid : No thyroid  mass, thyromegaly or thyroid  tenderness.  Cardiovascular:     Rate and Rhythm: Normal rate and regular rhythm.     Heart sounds: Normal heart sounds, S1 normal and S2 normal.  Pulmonary:     Effort: Pulmonary effort is normal.     Breath sounds: Normal breath sounds and air entry.  Abdominal:     General: There is no distension.     Palpations: Abdomen is soft. There is no mass.     Tenderness: There is no abdominal tenderness. There is no guarding or rebound.  Musculoskeletal:         General: Normal range of motion.     Cervical back: Full passive range of motion without pain, normal range of motion and neck supple. No tenderness.     Right lower leg: No edema.     Left lower leg: No edema.  Neurological:     Mental Status: She is alert.  Skin:    General: Skin is warm.  Psychiatric:        Mood and Affect: Mood normal.        Behavior: Behavior normal.        Thought Content: Thought content normal.  Vitals and nursing note reviewed. Exam conducted with a chaperone present.       A:         Well Woman GYN exam, MR, limited mobility, left hydrosalpinx                             P:        Pap smear collected today Encouraged annual mammogram screening  DXA ordered today Labs and immunizations to do with PMD Left dilated tube. Mother does not report patient displays discomfort. No intervention needed at this time. Encouraged healthy lifestyle practices   No follow-ups on file.  Almarie MARLA Carpen

## 2024-03-29 LAB — CYTOLOGY - PAP: Diagnosis: NEGATIVE

## 2024-03-30 NOTE — Addendum Note (Signed)
 Addended by: GLENNON ALMARIE POUR on: 03/30/2024 04:04 PM   Modules accepted: Level of Service

## 2024-04-23 ENCOUNTER — Encounter: Payer: Self-pay | Admitting: Obstetrics and Gynecology

## 2024-04-23 ENCOUNTER — Other Ambulatory Visit: Payer: Self-pay | Admitting: Obstetrics and Gynecology

## 2024-04-23 ENCOUNTER — Ambulatory Visit (HOSPITAL_BASED_OUTPATIENT_CLINIC_OR_DEPARTMENT_OTHER)
Admission: RE | Admit: 2024-04-23 | Discharge: 2024-04-23 | Disposition: A | Discharge status: Home / Self Care | Source: Ambulatory Visit | Attending: Obstetrics and Gynecology | Admitting: Obstetrics and Gynecology

## 2024-04-23 DIAGNOSIS — R296 Repeated falls: Secondary | ICD-10-CM | POA: Insufficient documentation

## 2024-04-23 DIAGNOSIS — E2839 Other primary ovarian failure: Secondary | ICD-10-CM | POA: Insufficient documentation

## 2024-04-23 DIAGNOSIS — Z01419 Encounter for gynecological examination (general) (routine) without abnormal findings: Secondary | ICD-10-CM | POA: Insufficient documentation

## 2024-04-23 DIAGNOSIS — M8000XA Age-related osteoporosis with current pathological fracture, unspecified site, initial encounter for fracture: Secondary | ICD-10-CM

## 2024-04-23 MED ORDER — DENOSUMAB 60 MG/ML ~~LOC~~ SOSY
60.0000 mg | PREFILLED_SYRINGE | Freq: Once | SUBCUTANEOUS | 0 refills | Status: AC
Start: 2024-04-23 — End: 2024-04-23

## 2024-04-23 MED ORDER — EVENITY 105 MG/1.17ML ~~LOC~~ SOSY: 210.0000 mg | PREFILLED_SYRINGE | Freq: Once | SUBCUTANEOUS | 0 refills | Status: AC

## 2024-04-23 NOTE — Telephone Encounter (Signed)
 Message from [employer] will only cover prolia: to get labs and should begin. Q 6 months  Left message: only insurance coverage for prolia.  Left message with mother [name], her caretaker, on r/b/a/I of the medication. [facility] for scheduling.   Dr. Glennon

## 2024-04-27 ENCOUNTER — Telehealth: Payer: Self-pay | Admitting: *Deleted

## 2024-04-27 DIAGNOSIS — M8000XA Age-related osteoporosis with current pathological fracture, unspecified site, initial encounter for fracture: Secondary | ICD-10-CM

## 2024-04-27 NOTE — Telephone Encounter (Signed)
 Call returned to [facility] Nursing Department [phone]. Called to confirm plan of care for labs, medications and BMD.   Left message to call [facility] Triage at [phone], option 4.  SABRA

## 2024-04-30 NOTE — Telephone Encounter (Signed)
 Call returned to [employer], LPN at [facility].   BMD 04/23/2024  Complete Metabolic Panel, Vitamin D, PTH Intact and calcium. Will be drawn at [facility], labs to faxed to Dr. Glennon.   Call placed to patients mother, [name], ok per dpr. Advised as seen above. Mom states she has been texting with Dr. Glennon, she is aware of results and was advised Evenity not covered per [facility] pharmacy.  Mom is aware benefits will be reviewed, she will be contacted once labs are back and benefits reviewed. Mom aware to call if any questions in the meantime. Mom authorizes Secretary/administrator at [employer].   Dr. Glennon -please advise on RX.  Cc: Damien for benefits

## 2024-05-01 ENCOUNTER — Ambulatory Visit
Admission: RE | Admit: 2024-05-01 | Discharge: 2024-05-01 | Disposition: A | Source: Ambulatory Visit | Attending: Obstetrics and Gynecology

## 2024-05-01 DIAGNOSIS — Z01419 Encounter for gynecological examination (general) (routine) without abnormal findings: Secondary | ICD-10-CM

## 2024-05-01 DIAGNOSIS — Z1231 Encounter for screening mammogram for malignant neoplasm of breast: Secondary | ICD-10-CM

## 2024-05-04 ENCOUNTER — Other Ambulatory Visit: Payer: Self-pay | Admitting: Family Medicine

## 2024-05-04 ENCOUNTER — Encounter: Payer: Self-pay | Admitting: Family Medicine

## 2024-05-04 DIAGNOSIS — Z1231 Encounter for screening mammogram for malignant neoplasm of breast: Secondary | ICD-10-CM

## 2024-05-04 DIAGNOSIS — Z139 Encounter for screening, unspecified: Secondary | ICD-10-CM

## 2024-05-16 ENCOUNTER — Other Ambulatory Visit: Payer: Self-pay | Admitting: *Deleted

## 2024-05-16 DIAGNOSIS — M8000XA Age-related osteoporosis with current pathological fracture, unspecified site, initial encounter for fracture: Secondary | ICD-10-CM

## 2024-05-16 MED ORDER — ROMOSOZUMAB-AQQG 105 MG/1.17ML ~~LOC~~ SOSY
210.0000 mg | PREFILLED_SYRINGE | Freq: Once | SUBCUTANEOUS | 0 refills | Status: AC
  Filled 2024-05-18: qty 2.34, 1d supply, fill #0

## 2024-05-16 MED ORDER — ROMOSOZUMAB-AQQG 105 MG/1.17ML ~~LOC~~ SOSY
210.0000 mg | PREFILLED_SYRINGE | Freq: Once | SUBCUTANEOUS | Status: AC
  Administered 2024-05-28: 210 mg via SUBCUTANEOUS

## 2024-05-16 NOTE — Telephone Encounter (Signed)
 Prescription for Evenity and referral sent to [facility].   Encounter closed.

## 2024-05-18 ENCOUNTER — Telehealth: Payer: Self-pay

## 2024-05-18 ENCOUNTER — Other Ambulatory Visit (HOSPITAL_COMMUNITY): Payer: Self-pay

## 2024-05-18 NOTE — Telephone Encounter (Signed)
 Buy/Bill (Office supplied medication)  Out-of-pocket cost due at time of clinic visit: $0  Number of injection/visits approved: ---  Primary: MEDICARE Co-insurance: 0% Admin fee co-insurance: 0%  Secondary: [place] MEDICAID Co-insurance:  Admin fee co-insurance:   Medical Benefit Details: Date Benefits were checked: 05/18/24 Deductible: $257 Met of $257 Required/ Coinsurance: 0%/ Admin Fee: 0%  Prior Auth: N/A PA# Expiration Date:   # of doses approved: -----------------------------------------------------------------------  Patient NOT eligible for Copay Card. Copay Card can make patient's cost as little as $25. Link to apply: https://www.amgensupportplus.[email or web address]/copay  ** This summary of benefits is an estimation of the patient's out-of-pocket cost. Exact cost may very based on individual plan coverage.

## 2024-05-18 NOTE — Telephone Encounter (Signed)
 I spoke to patients legal guardian. She said that [facility] had labs done 2 weeks ago & her calcium was checked then. These are the labs that Melissa wanted to make sure you received. Please check when you are back in the office to see if you have received a fax with her lab results. Routing to Dr Glennon.

## 2024-05-18 NOTE — Telephone Encounter (Signed)
 Crystal, LPN from [employer] health services called & wanted to make sure Dr Glennon received the [facility] that they faxed over for [place]. She also wanted to make sure we didn't need anything else from them or that anything was pending. Routing to Dr Glennon.  Crystals number is [phone]

## 2024-05-18 NOTE — Telephone Encounter (Signed)
 Evenity  VOB initiated via [email or web address]  Last OV:  Next OV:  Last Evenity  inj:  Next Evenity  inj DUE: [place]

## 2024-05-18 NOTE — Telephone Encounter (Signed)
 Medication will be filled under Medical Benefit. MTDM appointment request cancelled.

## 2024-05-18 NOTE — Telephone Encounter (Signed)
 Megan Walton

## 2024-05-19 ENCOUNTER — Other Ambulatory Visit (HOSPITAL_COMMUNITY): Payer: Self-pay

## 2024-05-21 ENCOUNTER — Other Ambulatory Visit: Payer: Self-pay

## 2024-05-22 ENCOUNTER — Telehealth: Payer: Self-pay

## 2024-05-22 NOTE — Telephone Encounter (Signed)
 [facility] called asking for a follow up on bone medication. Can return call to [phone] and ask for nursing.

## 2024-05-24 ENCOUNTER — Other Ambulatory Visit: Payer: Self-pay | Admitting: *Deleted

## 2024-05-24 DIAGNOSIS — M8000XA Age-related osteoporosis with current pathological fracture, unspecified site, initial encounter for fracture: Secondary | ICD-10-CM

## 2024-05-24 MED ORDER — ROMOSOZUMAB-AQQG 105 MG/1.17ML ~~LOC~~ SOSY
210.0000 mg | PREFILLED_SYRINGE | SUBCUTANEOUS | Status: AC
  Administered 2024-06-28: 11:00:00 210 mg via SUBCUTANEOUS

## 2024-05-24 NOTE — Telephone Encounter (Signed)
 Message left with [facility] to return call to office to discuss Evenity.

## 2024-05-24 NOTE — Telephone Encounter (Signed)
 Have you received labs on this patient? She is scheduled for 1st Evenity on 05/28/24.

## 2024-05-24 NOTE — Telephone Encounter (Signed)
 See Evenity  referral.

## 2024-05-24 NOTE — Telephone Encounter (Signed)
 Call to patient's mother, Megan Walton, okay to speak with per DPR. Advised of benefits as seen below and mother verbalized understanding. States she will defer scheduling to [facility] as they provide transportation to [place].   Voicemail left with [facility] to return call to office to discuss Evenity.

## 2024-05-25 LAB — LAB REPORT - SCANNED: EGFR: 105.2

## 2024-05-25 NOTE — Telephone Encounter (Signed)
 Labs dated 05/03/24 received and scanned into EPIC.   Crystal notified.   Routing to Dr. Glennon to review.

## 2024-05-25 NOTE — Telephone Encounter (Signed)
 Call placed to Crystal, advised no lab results received to date. Crystal will fax again to [phone], ATTN [facility] Triage.  Crystal request call back once received to confirm.

## 2024-05-28 ENCOUNTER — Ambulatory Visit

## 2024-05-28 DIAGNOSIS — M8000XA Age-related osteoporosis with current pathological fracture, unspecified site, initial encounter for fracture: Secondary | ICD-10-CM | POA: Diagnosis not present

## 2024-05-28 NOTE — Addendum Note (Signed)
 Addended by: MELITON PERKINS T on: 05/28/2024 09:09 AM   Modules accepted: Orders

## 2024-05-28 NOTE — Telephone Encounter (Signed)
 Per Dr. Glennon, patient will still need a calcium lab check one month after starting Evenity. Clotilda, CMA float aware to have patient schedule appointment while in the office today for Evenity injection. Future order placed.   Encounter closed.

## 2024-05-31 ENCOUNTER — Other Ambulatory Visit: Payer: Self-pay

## 2024-06-01 ENCOUNTER — Ambulatory Visit
Admission: RE | Admit: 2024-06-01 | Discharge: 2024-06-01 | Disposition: A | Discharge status: Home / Self Care | Source: Ambulatory Visit | Attending: Family Medicine | Admitting: Family Medicine

## 2024-06-01 DIAGNOSIS — Z139 Encounter for screening, unspecified: Secondary | ICD-10-CM

## 2024-06-28 ENCOUNTER — Ambulatory Visit (INDEPENDENT_AMBULATORY_CARE_PROVIDER_SITE_OTHER)

## 2024-06-28 DIAGNOSIS — M8000XA Age-related osteoporosis with current pathological fracture, unspecified site, initial encounter for fracture: Secondary | ICD-10-CM

## 2024-07-18 ENCOUNTER — Other Ambulatory Visit: Payer: Self-pay | Admitting: *Deleted

## 2024-07-18 DIAGNOSIS — M8000XA Age-related osteoporosis with current pathological fracture, unspecified site, initial encounter for fracture: Secondary | ICD-10-CM

## 2024-07-18 MED ORDER — ROMOSOZUMAB-AQQG 105 MG/1.17ML ~~LOC~~ SOSY
210.0000 mg | PREFILLED_SYRINGE | SUBCUTANEOUS | Status: AC
  Administered 2024-08-02: 210 mg via SUBCUTANEOUS

## 2024-07-20 ENCOUNTER — Telehealth: Payer: Self-pay

## 2024-07-20 ENCOUNTER — Other Ambulatory Visit (HOSPITAL_COMMUNITY): Payer: Self-pay

## 2024-07-20 NOTE — Telephone Encounter (Signed)
 Evenity  VOB initiated via [email or web address]  Last OV:  Next OV:  Last Evenity  inj: 06/28/24 Next Evenity  inj DUE: 07/29/24

## 2024-07-23 NOTE — Telephone Encounter (Signed)
 Buy/Bill (Office supplied medication)  Out-of-pocket cost due at time of clinic visit: $0  Number of injection/visits approved: ---  Primary: MEDICARE Co-insurance: 0% Admin fee co-insurance: 0%  Secondary: [place] MEDICAID Co-insurance: covers the Medicare Part B coinsurance and deductible Admin fee co-insurance:   Medical Benefit Details: Date Benefits were checked: 07/20/24 Deductible: $0 Met of $283 Required/ Coinsurance: 0%/ Admin Fee: 0%  Prior Auth: N/A PA# Expiration Date:   # of doses approved: -----------------------------------------------------------------------  Patient NOT eligible for Copay Card. Copay Card can make patient's cost as little as $25. Link to apply: https://www.amgensupportplus.[email or web address]/copay  ** This summary of benefits is an estimation of the patient's out-of-pocket cost. Exact cost may very based on individual plan coverage.

## 2024-07-23 NOTE — Telephone Encounter (Signed)
 SABRA

## 2024-07-26 NOTE — Telephone Encounter (Signed)
 See referral

## 2024-08-02 ENCOUNTER — Ambulatory Visit (INDEPENDENT_AMBULATORY_CARE_PROVIDER_SITE_OTHER)

## 2024-08-02 DIAGNOSIS — M8000XA Age-related osteoporosis with current pathological fracture, unspecified site, initial encounter for fracture: Secondary | ICD-10-CM
